# Patient Record
Sex: Male | Born: 1965 | Race: White | Hispanic: No | Marital: Single | Smoking: Never smoker
Health system: Southern US, Community
[De-identification: ages and names within clinical notes are randomized; demographics above are authoritative.]

## PROBLEM LIST (undated history)

## (undated) DIAGNOSIS — E785 Hyperlipidemia, unspecified: Secondary | ICD-10-CM

## (undated) DIAGNOSIS — E7800 Pure hypercholesterolemia, unspecified: Secondary | ICD-10-CM

## (undated) DIAGNOSIS — G8929 Other chronic pain: Secondary | ICD-10-CM

## (undated) DIAGNOSIS — J1282 Pneumonia due to coronavirus disease 2019: Secondary | ICD-10-CM

## (undated) DIAGNOSIS — E119 Type 2 diabetes mellitus without complications: Secondary | ICD-10-CM

## (undated) DIAGNOSIS — U071 COVID-19: Secondary | ICD-10-CM

## (undated) DIAGNOSIS — K5790 Diverticulosis of intestine, part unspecified, without perforation or abscess without bleeding: Secondary | ICD-10-CM

## (undated) DIAGNOSIS — L570 Actinic keratosis: Secondary | ICD-10-CM

## (undated) DIAGNOSIS — M1990 Unspecified osteoarthritis, unspecified site: Secondary | ICD-10-CM

## (undated) DIAGNOSIS — M67911 Unspecified disorder of synovium and tendon, right shoulder: Secondary | ICD-10-CM

## (undated) HISTORY — PX: COLONOSCOPY: SHX174

## (undated) HISTORY — PX: WISDOM TOOTH EXTRACTION: SHX21

## (undated) HISTORY — DX: Actinic keratosis: L57.0

## (undated) HISTORY — PX: TONSILLECTOMY: SUR1361

---

## 2006-11-13 ENCOUNTER — Ambulatory Visit: Payer: Self-pay | Admitting: Gastroenterology

## 2006-12-03 ENCOUNTER — Ambulatory Visit: Payer: Self-pay | Admitting: Gastroenterology

## 2014-07-19 ENCOUNTER — Other Ambulatory Visit: Payer: Self-pay | Admitting: Sports Medicine

## 2014-07-19 DIAGNOSIS — M25512 Pain in left shoulder: Secondary | ICD-10-CM

## 2014-07-29 ENCOUNTER — Other Ambulatory Visit: Payer: Self-pay | Admitting: Sports Medicine

## 2014-07-29 DIAGNOSIS — M25512 Pain in left shoulder: Secondary | ICD-10-CM

## 2014-08-01 ENCOUNTER — Other Ambulatory Visit: Payer: Self-pay

## 2020-06-01 ENCOUNTER — Other Ambulatory Visit: Payer: Self-pay

## 2020-06-01 DIAGNOSIS — Z20822 Contact with and (suspected) exposure to covid-19: Secondary | ICD-10-CM

## 2020-06-03 LAB — SARS-COV-2, NAA 2 DAY TAT

## 2020-06-03 LAB — NOVEL CORONAVIRUS, NAA: SARS-CoV-2, NAA: DETECTED — AB

## 2020-06-12 ENCOUNTER — Emergency Department: Payer: Managed Care, Other (non HMO)

## 2020-06-12 ENCOUNTER — Encounter: Payer: Self-pay | Admitting: Radiology

## 2020-06-12 ENCOUNTER — Other Ambulatory Visit: Payer: Self-pay

## 2020-06-12 ENCOUNTER — Inpatient Hospital Stay
Admission: EM | Admit: 2020-06-12 | Discharge: 2020-06-17 | DRG: 177 | Disposition: A | Discharge status: Home / Self Care | Payer: Managed Care, Other (non HMO) | Attending: Internal Medicine | Admitting: Internal Medicine

## 2020-06-12 DIAGNOSIS — E1165 Type 2 diabetes mellitus with hyperglycemia: Secondary | ICD-10-CM

## 2020-06-12 DIAGNOSIS — R739 Hyperglycemia, unspecified: Secondary | ICD-10-CM | POA: Diagnosis not present

## 2020-06-12 DIAGNOSIS — J9601 Acute respiratory failure with hypoxia: Secondary | ICD-10-CM | POA: Diagnosis present

## 2020-06-12 DIAGNOSIS — U071 COVID-19: Secondary | ICD-10-CM | POA: Diagnosis not present

## 2020-06-12 DIAGNOSIS — E119 Type 2 diabetes mellitus without complications: Secondary | ICD-10-CM

## 2020-06-12 DIAGNOSIS — J1282 Pneumonia due to coronavirus disease 2019: Secondary | ICD-10-CM

## 2020-06-12 HISTORY — DX: Pure hypercholesterolemia, unspecified: E78.00

## 2020-06-12 LAB — CBC
HCT: 40 % (ref 39.0–52.0)
Hemoglobin: 13.4 g/dL (ref 13.0–17.0)
MCH: 29.3 pg (ref 26.0–34.0)
MCHC: 33.5 g/dL (ref 30.0–36.0)
MCV: 87.5 fL (ref 80.0–100.0)
Platelets: 246 10*3/uL (ref 150–400)
RBC: 4.57 MIL/uL (ref 4.22–5.81)
RDW: 11.8 % (ref 11.5–15.5)
WBC: 8.1 10*3/uL (ref 4.0–10.5)
nRBC: 0 % (ref 0.0–0.2)

## 2020-06-12 LAB — BASIC METABOLIC PANEL
Anion gap: 16 — ABNORMAL HIGH (ref 5–15)
BUN: 19 mg/dL (ref 6–20)
CO2: 24 mmol/L (ref 22–32)
Calcium: 9.1 mg/dL (ref 8.9–10.3)
Chloride: 92 mmol/L — ABNORMAL LOW (ref 98–111)
Creatinine, Ser: 0.87 mg/dL (ref 0.61–1.24)
GFR, Estimated: >60 mL/min (ref >60)
Glucose, Bld: 489 mg/dL — ABNORMAL HIGH (ref 70–99)
Potassium: 4.9 mmol/L (ref 3.5–5.1)
Sodium: 132 mmol/L — ABNORMAL LOW (ref 135–145)

## 2020-06-12 MED ORDER — SODIUM CHLORIDE 0.9 % IV BOLUS
500.0000 mL | Freq: Once | INTRAVENOUS | Status: AC
  Administered 2020-06-12: 500 mL via INTRAVENOUS

## 2020-06-12 MED ORDER — SODIUM CHLORIDE 0.9 % IV SOLN
200.0000 mg | Freq: Once | INTRAVENOUS | Status: AC
  Administered 2020-06-13: 200 mg via INTRAVENOUS
  Filled 2020-06-12: qty 200

## 2020-06-12 MED ORDER — METHYLPREDNISOLONE SODIUM SUCC 125 MG IJ SOLR
125.0000 mg | Freq: Once | INTRAMUSCULAR | Status: AC
  Administered 2020-06-12: 125 mg via INTRAVENOUS
  Filled 2020-06-12: qty 2

## 2020-06-12 MED ORDER — INSULIN ASPART 100 UNIT/ML ~~LOC~~ SOLN
5.0000 [IU] | Freq: Once | SUBCUTANEOUS | Status: AC
  Administered 2020-06-12: 5 [IU] via INTRAVENOUS

## 2020-06-12 MED ORDER — INSULIN ASPART 100 UNIT/ML ~~LOC~~ SOLN
SUBCUTANEOUS | Status: AC
  Filled 2020-06-12: qty 1

## 2020-06-12 MED ORDER — SODIUM CHLORIDE 0.9 % IV SOLN
100.0000 mg | Freq: Every day | INTRAVENOUS | Status: AC
  Administered 2020-06-14 – 2020-06-17 (×4): 100 mg via INTRAVENOUS
  Filled 2020-06-12: qty 20
  Filled 2020-06-12: qty 100
  Filled 2020-06-12: qty 20
  Filled 2020-06-12: qty 100

## 2020-06-12 NOTE — Progress Notes (Signed)
Pt placed on HFNC at 8 lpm due to desats. O2 sat currently 93%

## 2020-06-12 NOTE — ED Provider Notes (Signed)
[facility] Emergency Department Provider Note  ____________________________________________   Event Date/Time   First MD Initiated Contact with Patient 06/12/20 2310     (approximate)  I have reviewed the triage vital signs and the nursing notes.   HISTORY  Chief Complaint Shortness of Breath    HPI Christian Vazquez is a 55 y.o. male with no significant chronic medical issues who is generally quite healthy.  He is unvaccinated for COVID-19.  He presents for worsening shortness of breath over the last 3 days in the setting of a positive COVID-19 diagnosis about 2 weeks ago January 12).  He said that he felt better initially with fever, loss of smell and taste, sore throat, and shortness of breath, but that he started to feel better.  However he started feel worse again over the last 3 days and has become severe as of tonight.  Exertion makes his symptoms much worse and rest makes them a little bit better.  He still has changes in taste but the primary issue is the acute dyspnea.  He has no history of blood clots.  He has no history of diabetes.  He is not having chest pain.           Past Medical History:  Diagnosis Date  . Hypercholesteremia     Patient Active Problem List   Diagnosis Date Noted  . Pneumonia due to COVID-19 virus 06/13/2020  . Acute respiratory failure due to COVID-19 ([place]) 06/13/2020  . Hyperglycemia due to new onset type 2 diabetes mellitus ([place]) 06/13/2020    History reviewed. No pertinent surgical history.  Prior to Admission medications   Not on File    Allergies Patient has no known allergies.  History reviewed. No pertinent family history.  Social History Social History   Tobacco Use  . Smoking status: Never Smoker  . Smokeless tobacco: Never Used    Review of Systems Constitutional: No fever/chills Eyes: No visual changes. ENT: Changes to smell and taste.  No sore throat. Cardiovascular: Denies chest  pain. Respiratory: Severe shortness of breath particular with exertion. Gastrointestinal: No abdominal pain.  No nausea, no vomiting.  No diarrhea.  No constipation. Genitourinary: Negative for dysuria. Musculoskeletal: Negative for neck pain.  Negative for back pain. Integumentary: Negative for rash. Neurological: Negative for headaches, focal weakness or numbness.   ____________________________________________   PHYSICAL EXAM:  VITAL SIGNS: ED Triage Vitals [06/12/20 2003]  Enc Vitals Group     BP 127/86     Pulse Rate (!) 112     Resp 20     Temp 97.7 F (36.5 C)     Temp Source Oral     SpO2 (!) 77 %     Weight 83.5 kg (184 lb)     Height 1.753 m (5\' 9" )     Head Circumference      Peak Flow      Pain Score 0     Pain Loc      Pain Edu?      Excl. in [place]?     Constitutional: Alert and oriented.  Eyes: Conjunctivae are normal.  Head: Atraumatic. Nose: No congestion/rhinnorhea. Mouth/Throat: Patient is wearing a mask. Neck: No stridor.  No meningeal signs.   Cardiovascular: Tachycardia, regular rhythm. Good peripheral circulation. Respiratory: Mild tachypnea, good air movement throughout with no significant course of breath sounds.  Occasional cough when taking deep breaths.  Patient is speaking full sentences but is currently on substantial oxygen support  of 15 L/min of high flow nasal cannula which is maintaining oxygenation in the upper 80s to lower 90s. Gastrointestinal: Soft and nontender. No distention.  Musculoskeletal: No lower extremity tenderness nor edema. No gross deformities of extremities. Neurologic:  Normal speech and language. No gross focal neurologic deficits are appreciated.  Skin:  Skin is warm, dry and intact. Psychiatric: Mood and affect are normal. Speech and behavior are normal.  ____________________________________________   LABS (all labs ordered are listed, but only abnormal results are displayed)  Labs Reviewed  BASIC METABOLIC PANEL  - Abnormal; Notable for the following components:      Result Value   Sodium 132 (*)    Chloride 92 (*)    Glucose, Bld 489 (*)    Anion gap 16 (*)    All other components within normal limits  CBG MONITORING, ED - Abnormal; Notable for the following components:   Glucose-Capillary 295 (*)    All other components within normal limits  CBC  HEMOGLOBIN A1C   ____________________________________________  EKG  ED ECG REPORT I, Hinda Kehr, the attending physician, personally viewed and interpreted this ECG.  Date: 06/11/2020 EKG Time: 20: 17 Rate: 104 Rhythm: Sinus tachycardia QRS Axis: normal Intervals: normal ST/T Wave abnormalities: normal Narrative Interpretation: no evidence of acute ischemia ____________________________________________  RADIOLOGY I, Hinda Kehr, personally viewed and evaluated these images (plain radiographs) as part of my medical decision making, as well as reviewing the written report by the radiologist.  ED MD interpretation: Extensive multifocal bilateral pneumonia compatible with COVID-19.  CTA chest demonstrates no evidence of PE but confirms multifocal pneumonia.  Official radiology report(s): DG Chest 2 View  Result Date: 06/12/2020 CLINICAL DATA:  COVID-19 diagnosed 06/01/2020, increasing shortness of breath, hypoxia EXAM: CHEST - 2 VIEW COMPARISON:  None. FINDINGS: Frontal and lateral views of the chest demonstrate an unremarkable cardiac silhouette. Multifocal bilateral airspace disease greatest in the right upper and left lower lobes. No effusion or pneumothorax. No acute bony abnormalities. IMPRESSION: 1. Multifocal bilateral pneumonia compatible with COVID 19. Electronically Signed   By: Randa Ngo M.D.   On: 06/12/2020 20:45   CT Angio Chest PE W/Cm &/Or Wo Cm  Result Date: 06/13/2020 CLINICAL DATA:  Pulmonary embolus suspected with high probability. COVID diagnosis on January 12. Increasing shortness of breath with decreased oxygen  saturations. EXAM: CT ANGIOGRAPHY CHEST WITH CONTRAST TECHNIQUE: Multidetector CT imaging of the chest was performed using the standard protocol during bolus administration of intravenous contrast. Multiplanar CT image reconstructions and MIPs were obtained to evaluate the vascular anatomy. CONTRAST:  140mL OMNIPAQUE IOHEXOL 350 MG/ML SOLN COMPARISON:  Chest radiograph 06/12/2020 FINDINGS: Cardiovascular: Good opacification of the central and segmental pulmonary arteries. No focal filling defects. No evidence of significant pulmonary embolus. Cardiac enlargement. No pericardial effusions. Normal caliber thoracic aorta. No dissection. Great vessel origins are patent. Mediastinum/Nodes: Esophagus is decompressed. Scattered lymph nodes are not pathologically enlarged. Lungs/Pleura: Patchy and confluent alveolar infiltrates in the lungs, most prominent in the periphery. Changes are consistent with COVID pneumonia. No pleural effusions. No pneumothorax. Airways are patent. Upper Abdomen: Diffuse fatty infiltration of the liver. No acute abnormalities demonstrated in the visualized upper abdomen. Musculoskeletal: No chest wall abnormality. No acute or significant osseous findings. Review of the MIP images confirms the above findings. IMPRESSION: 1. No evidence of significant pulmonary embolus. 2. Patchy and confluent alveolar infiltrates in the lungs, most prominent in the periphery. Changes are consistent with COVID pneumonia. 3. Diffuse fatty infiltration of the liver.  Electronically Signed   By: Lucienne Capers M.D.   On: 06/13/2020 00:33    ____________________________________________   PROCEDURES   Procedure(s) performed (including Critical Care):  .Critical Care Performed by: Hinda Kehr, MD Authorized by: Hinda Kehr, MD   Critical care provider statement:    Critical care time (minutes):  45   Critical care time was exclusive of:  Separately billable procedures and treating other patients    Critical care was necessary to treat or prevent imminent or life-threatening deterioration of the following conditions:  Respiratory failure   Critical care was time spent personally by me on the following activities:  Development of treatment plan with patient or surrogate, discussions with consultants, evaluation of patient's response to treatment, examination of patient, obtaining history from patient or surrogate, ordering and performing treatments and interventions, ordering and review of laboratory studies, ordering and review of radiographic studies, pulse oximetry, re-evaluation of patient's condition and review of old charts .1-3 Lead EKG Interpretation Performed by: Hinda Kehr, MD Authorized by: Hinda Kehr, MD     Interpretation: abnormal     ECG rate:  106   ECG rate assessment: tachycardic     Rhythm: sinus tachycardia     Ectopy: none     Conduction: normal       ____________________________________________   INITIAL IMPRESSION / MDM / ASSESSMENT AND PLAN / ED COURSE  As part of my medical decision making, I reviewed the following data within the [place] notes reviewed and incorporated, Labs reviewed , EKG interpreted , Old chart reviewed, Radiograph reviewed , Discussed with admitting physician  and Notes from prior ED visits   Differential diagnosis includes, but is not limited to, COVID-19 pneumonia, pulmonary embolism, new onset diabetes, bacterial pneumonia, heart failure.  The patient is on the cardiac monitor to evaluate for evidence of arrhythmia and/or significant heart rate changes.  I verified in the computer that the patient had a positive COVID-19 diagnosis on 06/01/2020.  Given his profound hypoxemia with generally normal breath sounds and his tachycardia I am concerned about the possibility of pulmonary embolism.  I personally reviewed the patient's imaging and agree with the radiologist's interpretation that he has extensive  multifocal pneumonia and it may simply be the extent of the COVID-19 infection, but I will obtain CTA chest to rule out PE.  Given his positive COVID-19 diagnosis there is no benefit to waiting on a D-dimer.  Lab work is notable for a normal CBC and a normal BMP except for a glucose of 489 in the setting of no prior diagnosis of diabetes.  His anion gap is 16 but I do not feel that this represents DKA, rather is reflective of his volume status, respiratory failure, etc.  I am giving him 500 mL normal saline as well as regular insulin 5 units IV and will recheck a CBG in about an hour.  I ordered Solu-Medrol 125 mg IV and remdesivir given the respiratory failure due to COVID-19.  Anticipate hospitalist admission.     Clinical Course as of 06/13/20 0127  Nancy Fetter Jun 12, 2020  2346 I also ordered a hemoglobin A1c so that it could hopefully start being processed as it will be needed for his inpatient management. [CF]  Mon Jun 13, 2020  0056 CT Angio Chest PE W/Cm &/Or Wo Cm Generally reassuring CTA chest with no evidence of PE.  Infiltrates consistent with multifocal COVID-19 pneumonia.  Patient stable on supplemental oxygen as described above.  Consulting  hospitalist service for admission. [CF]  0127 Discussed case by phone with Dr. Damita Dunnings with hospitalist service who will admit. [CF]    Clinical Course User Index [CF] Hinda Kehr, MD     ____________________________________________  FINAL CLINICAL IMPRESSION(S) / ED DIAGNOSES  Final diagnoses:  Acute hypoxemic respiratory failure due to COVID-19 [facility])  Hyperglycemia     MEDICATIONS GIVEN DURING THIS VISIT:  Medications  remdesivir 200 mg in sodium chloride 0.9% 250 mL IVPB (200 mg Intravenous New Bag/Given 06/13/20 0122)    Followed by  remdesivir 100 mg in sodium chloride 0.9 % 100 mL IVPB (has no administration in time range)  methylPREDNISolone sodium succinate (SOLU-MEDROL) 125 mg/2 mL injection 125 mg (125 mg Intravenous Given  06/12/20 2331)  insulin aspart (novoLOG) injection 5 Units (5 Units Intravenous Given 06/12/20 2339)  sodium chloride 0.9 % bolus 500 mL (0 mLs Intravenous Stopped 06/13/20 0056)  iohexol (OMNIPAQUE) 350 MG/ML injection 100 mL (100 mLs Intravenous Contrast Given 06/13/20 0025)     ED Discharge Orders    None      *Please note:  Christian Vazquez was evaluated in Emergency Department on 06/13/2020 for the symptoms described in the history of present illness. He was evaluated in the context of the global COVID-19 pandemic, which necessitated consideration that the patient might be at risk for infection with the SARS-CoV-2 virus that causes COVID-19. Institutional protocols and algorithms that pertain to the evaluation of patients at risk for COVID-19 are in a state of rapid change based on information released by regulatory bodies including the CDC and federal and state organizations. These policies and algorithms were followed during the patient's care in the ED.  Some ED evaluations and interventions may be delayed as a result of limited staffing during and after the pandemic.*  Note:  This document was prepared using Dragon voice recognition software and may include unintentional dictation errors.   Hinda Kehr, MD 06/13/20 [phone]

## 2020-06-12 NOTE — ED Notes (Signed)
Verbal order from Dr. Cherylann Banas to call respiratory to place pt no HFNC. This RN called.

## 2020-06-12 NOTE — ED Notes (Signed)
Oxygen tank switched out.  ?

## 2020-06-12 NOTE — Progress Notes (Signed)
Remdesivir - Pharmacy Brief Note   A/P:  Remdesivir 200 mg IVPB once followed by 100 mg IVPB daily x 4 days.   Naman Spychalski, PharmD Clinical Pharmacist  

## 2020-06-12 NOTE — ED Triage Notes (Signed)
Pt to ED reporting a COVID dx on Jan 12th that he has not been able to recover form., increasing SOB with oxygen saturations in the 70s at home on RA. Pt was 77% on RA upon arrival. Immediately placed on 2L of oxygen that only increased pt to 86%. Pts oxygen then increased to 3L via [place]. Pt reporting dizziness and generalized fatigue. Afebrile.

## 2020-06-13 DIAGNOSIS — E119 Type 2 diabetes mellitus without complications: Secondary | ICD-10-CM | POA: Diagnosis not present

## 2020-06-13 DIAGNOSIS — J9601 Acute respiratory failure with hypoxia: Secondary | ICD-10-CM

## 2020-06-13 DIAGNOSIS — U071 COVID-19: Principal | ICD-10-CM

## 2020-06-13 DIAGNOSIS — J1282 Pneumonia due to coronavirus disease 2019: Secondary | ICD-10-CM

## 2020-06-13 DIAGNOSIS — E1165 Type 2 diabetes mellitus with hyperglycemia: Secondary | ICD-10-CM

## 2020-06-13 DIAGNOSIS — R739 Hyperglycemia, unspecified: Secondary | ICD-10-CM | POA: Diagnosis present

## 2020-06-13 LAB — HEMOGLOBIN A1C
Hgb A1c MFr Bld: 11 % — ABNORMAL HIGH (ref 4.8–5.6)
Mean Plasma Glucose: 269 mg/dL

## 2020-06-13 LAB — CBG MONITORING, ED
Glucose-Capillary: 250 mg/dL — ABNORMAL HIGH (ref 70–99)
Glucose-Capillary: 295 mg/dL — ABNORMAL HIGH (ref 70–99)
Glucose-Capillary: 307 mg/dL — ABNORMAL HIGH (ref 70–99)
Glucose-Capillary: 312 mg/dL — ABNORMAL HIGH (ref 70–99)
Glucose-Capillary: 324 mg/dL — ABNORMAL HIGH (ref 70–99)

## 2020-06-13 MED ORDER — ONDANSETRON HCL 4 MG/2ML IJ SOLN: 4.0000 mg | Freq: Four times a day (QID) | INTRAMUSCULAR | Status: DC | PRN

## 2020-06-13 MED ORDER — PREDNISONE 50 MG PO TABS
50.0000 mg | ORAL_TABLET | Freq: Every day | ORAL | Status: DC
  Administered 2020-06-16 – 2020-06-17 (×2): 50 mg via ORAL
  Filled 2020-06-13 (×2): qty 1

## 2020-06-13 MED ORDER — METHYLPREDNISOLONE SODIUM SUCC 125 MG IJ SOLR
0.5000 mg/kg | Freq: Two times a day (BID) | INTRAMUSCULAR | Status: AC
  Administered 2020-06-13 – 2020-06-15 (×6): 41.875 mg via INTRAVENOUS
  Filled 2020-06-13 (×6): qty 2

## 2020-06-13 MED ORDER — GUAIFENESIN-DM 100-10 MG/5ML PO SYRP: 10.0000 mL | ORAL_SOLUTION | ORAL | Status: DC | PRN

## 2020-06-13 MED ORDER — ENOXAPARIN SODIUM 40 MG/0.4ML ~~LOC~~ SOLN
40.0000 mg | SUBCUTANEOUS | Status: DC
  Administered 2020-06-13 – 2020-06-17 (×5): 40 mg via SUBCUTANEOUS
  Filled 2020-06-13 (×5): qty 0.4

## 2020-06-13 MED ORDER — ALBUTEROL SULFATE HFA 108 (90 BASE) MCG/ACT IN AERS
2.0000 | INHALATION_SPRAY | Freq: Four times a day (QID) | RESPIRATORY_TRACT | Status: DC
  Administered 2020-06-13 – 2020-06-17 (×18): 2 via RESPIRATORY_TRACT
  Filled 2020-06-13: qty 6.7

## 2020-06-13 MED ORDER — HYDROCOD POLST-CPM POLST ER 10-8 MG/5ML PO SUER: 5.0000 mL | Freq: Two times a day (BID) | ORAL | Status: DC | PRN

## 2020-06-13 MED ORDER — LINAGLIPTIN 5 MG PO TABS
5.0000 mg | ORAL_TABLET | Freq: Every day | ORAL | Status: DC
  Administered 2020-06-14 – 2020-06-17 (×4): 5 mg via ORAL
  Filled 2020-06-13 (×7): qty 1

## 2020-06-13 MED ORDER — IOHEXOL 350 MG/ML SOLN
100.0000 mL | Freq: Once | INTRAVENOUS | Status: AC | PRN
  Administered 2020-06-13: 100 mL via INTRAVENOUS

## 2020-06-13 MED ORDER — ONDANSETRON HCL 4 MG PO TABS: 4.0000 mg | ORAL_TABLET | Freq: Four times a day (QID) | ORAL | Status: DC | PRN

## 2020-06-13 MED ORDER — INSULIN ASPART 100 UNIT/ML ~~LOC~~ SOLN
4.0000 [IU] | Freq: Three times a day (TID) | SUBCUTANEOUS | Status: DC
  Administered 2020-06-13 – 2020-06-14 (×5): 4 [IU] via SUBCUTANEOUS
  Filled 2020-06-13 (×3): qty 1

## 2020-06-13 MED ORDER — BARICITINIB 2 MG PO TABS
2.0000 mg | ORAL_TABLET | Freq: Every day | ORAL | Status: DC
  Administered 2020-06-13: 2 mg via ORAL
  Filled 2020-06-13: qty 1

## 2020-06-13 MED ORDER — LIVING WELL WITH DIABETES BOOK
Freq: Once | Status: AC
  Filled 2020-06-13: qty 1

## 2020-06-13 MED ORDER — INSULIN ASPART 100 UNIT/ML ~~LOC~~ SOLN
0.0000 [IU] | Freq: Every day | SUBCUTANEOUS | Status: DC
  Administered 2020-06-13: 4 [IU] via SUBCUTANEOUS
  Administered 2020-06-14: 2 [IU] via SUBCUTANEOUS
  Administered 2020-06-15: 4 [IU] via SUBCUTANEOUS
  Filled 2020-06-13 (×3): qty 1

## 2020-06-13 MED ORDER — INSULIN DETEMIR 100 UNIT/ML ~~LOC~~ SOLN
0.1000 [IU]/kg | Freq: Two times a day (BID) | SUBCUTANEOUS | Status: DC
  Administered 2020-06-13 (×2): 8 [IU] via SUBCUTANEOUS
  Filled 2020-06-13 (×3): qty 0.08

## 2020-06-13 MED ORDER — INSULIN ASPART 100 UNIT/ML ~~LOC~~ SOLN
0.0000 [IU] | Freq: Three times a day (TID) | SUBCUTANEOUS | Status: DC
  Administered 2020-06-13: 11 [IU] via SUBCUTANEOUS
  Administered 2020-06-13: 5 [IU] via SUBCUTANEOUS
  Administered 2020-06-13 – 2020-06-14 (×2): 11 [IU] via SUBCUTANEOUS
  Administered 2020-06-14: 15 [IU] via SUBCUTANEOUS
  Administered 2020-06-14: 11 [IU] via SUBCUTANEOUS
  Administered 2020-06-15: 8 [IU] via SUBCUTANEOUS
  Administered 2020-06-15: 11 [IU] via SUBCUTANEOUS
  Administered 2020-06-15: 3 [IU] via SUBCUTANEOUS
  Administered 2020-06-16: 5 [IU] via SUBCUTANEOUS
  Administered 2020-06-16: 3 [IU] via SUBCUTANEOUS
  Administered 2020-06-17: 8 [IU] via SUBCUTANEOUS
  Filled 2020-06-13 (×11): qty 1

## 2020-06-13 MED ORDER — ASCORBIC ACID 500 MG PO TABS
500.0000 mg | ORAL_TABLET | Freq: Every day | ORAL | Status: DC
  Administered 2020-06-13 – 2020-06-17 (×5): 500 mg via ORAL
  Filled 2020-06-13 (×5): qty 1

## 2020-06-13 MED ORDER — ZINC SULFATE 220 (50 ZN) MG PO CAPS
220.0000 mg | ORAL_CAPSULE | Freq: Every day | ORAL | Status: DC
  Administered 2020-06-13 – 2020-06-17 (×5): 220 mg via ORAL
  Filled 2020-06-13 (×5): qty 1

## 2020-06-13 MED ORDER — ACETAMINOPHEN 325 MG PO TABS: 650.0000 mg | ORAL_TABLET | Freq: Four times a day (QID) | ORAL | Status: DC | PRN

## 2020-06-13 MED ORDER — INSULIN STARTER KIT- PEN NEEDLES (ENGLISH)
1.0000 | Freq: Once | Status: AC
  Administered 2020-06-13: 1
  Filled 2020-06-13: qty 1

## 2020-06-13 MED ORDER — BARICITINIB 1 MG PO TABS
4.0000 mg | ORAL_TABLET | Freq: Every day | ORAL | Status: DC
  Administered 2020-06-14 – 2020-06-17 (×4): 4 mg via ORAL
  Filled 2020-06-13 (×3): qty 4
  Filled 2020-06-13: qty 2

## 2020-06-13 NOTE — ED Notes (Signed)
Patient denies pain and is resting comfortably.  

## 2020-06-13 NOTE — ED Notes (Signed)
PT PLACED ON NONREBREATHER AT 15LPM OVER HFNC AT 15LPM FOR POX OF 84%.

## 2020-06-13 NOTE — ED Notes (Signed)
Waiting on remdisivir

## 2020-06-13 NOTE — ED Notes (Signed)
Pharmacy to send up medications 

## 2020-06-13 NOTE — ED Notes (Signed)
Instructed patient on how to lie prone. Advised patient if he is able to tolerate, then to do so.

## 2020-06-13 NOTE — ED Notes (Signed)
2 PUFFS OF VENTOLIN ADMINISTERED. COMPUTER WILL NOT ALLOW RN TO COMPLETE DOCUMENTATION OF ADMINISTRATION.

## 2020-06-13 NOTE — ED Notes (Signed)
REPORT TO Aldona Bar, RN.

## 2020-06-13 NOTE — ED Notes (Signed)
Patient transported to CT 

## 2020-06-13 NOTE — Progress Notes (Addendum)
Inpatient Diabetes Program Recommendations  AACE/ADA: New Consensus Statement on Inpatient Glycemic Control   Target Ranges:  Prepandial:   less than 140 mg/dL      Peak postprandial:   less than 180 mg/dL (1-2 hours)      Critically ill patients:  140 - 180 mg/dL  Results for Christian Vazquez, Christian Vazquez (MRN [id number]) as of 06/13/2020 08:41  Ref. Range 06/13/2020 00:56 06/13/2020 08:37  Glucose-Capillary Latest Ref Range: 70 - 99 mg/dL 295 (H) 324 (H)   Results for Christian Vazquez, Christian Vazquez (MRN [id number]) as of 06/13/2020 08:41  Ref. Range 06/12/2020 20:29  Glucose Latest Ref Range: 70 - 99 mg/dL 489 (H)  Hemoglobin A1C Latest Ref Range: 4.8 - 5.6 % 11.0 (H)   Review of Glycemic Control  Diabetes history: No Outpatient Diabetes medications: NA Current orders for Inpatient glycemic control: Levemir 8 units BID, Novolog 0-15 units TID with meals, Novolog 0-5 units QHS, Novolog 4 units TID with meals, Tradjenta 5 mg daily; Solumedrol 41.875 mg Q12H  Inpatient Diabetes Program Recommendations:    HbgA1C: A1C 11% on 06/12/20 indicating an average glucose of 269 mg/dl over the past 2-3 months.  NOTE: Per chart review, patient has no prior DM hx. Initial glucose 489 mg/dl and A1C 11% on 06/12/20. Ordered Living Well with DM book, insulin starter kit, RD consult for diet education, and patient education by bedside nursing. Will plan to speak with patient when appropriate about new DM dx.  Addendum 06/13/20_0 :45-Spoke with patient over phone regarding glucose and new DM dx. Patient states that he has a PCP ([facility], [place]) and that he last seen PCP about 1 year ago. Patient states that he had lab work done with last visit and as far as he knows his glucose was normal at that time. Patient states that he has been sick with COVID and that 1 week ago he used a teledoc and was prescribed Prednisone (for 5 days) which he finished taking the Prednisone pills as prescribed. Discussed initial glucose  of 489 mg/dl and A1C of 11% on 06/12/20. Explained how steroids impact glycemic control and informed patient that recent steroids likely contributed to initial glucose and A1C results on 06/12/20. Patient reports that he has not noticed any symptoms of hyperglycemia over the past week but also notes that since he has been sick with COVID he has not had very much to eat or drink and he feels he was dehydrated over the past week. Discussed impact of nutrition, exercise, stress, sickness, and medications on diabetes control. Informed patient that a Living Well with diabetes booklet was ordered and patient states that he has the book. Encouraged patient to read through entire book to learn more about DM. Informed patient that diabetes coordinator will call him back tomorrow to discuss DM further and encouraged him to write down any questions he has as he reads through the Living Well with DM book. Patient verbalized understanding of information discussed and he states that he has no further questions at this time related to diabetes.   RNs to provide ongoing basic DM education at bedside with this patient and engage patient to actively check blood glucose and administer insulin injections.   Thanks, Barnie Alderman, RN, MSN, CDE Diabetes Coordinator Inpatient Diabetes Program [phone] (Team Pager from 8am to 5pm)

## 2020-06-13 NOTE — Progress Notes (Signed)
Patient ID: Christian Vazquez, male   DOB: 09/14/1965, 55 y.o.   MRN: [id number] [facility] PROGRESS NOTE  PARKS CZAJKOWSKI VOZ:[id number] DOB: 08-02-1965 DOA: 06/12/2020 PCP: System, Provider Not In  HPI/Subjective: Patient coming in feeling very weak, short of breath.  Patient with COVID-19 pneumonia and acute hypoxic respiratory failure requiring 100% nonrebreather at 15 L high flow nasal cannula.  Objective: Vitals:   06/13/20 0915 06/13/20 1030  BP:  (!) 122/98  Pulse: 95 (!) 106  Resp: (!) 25 13  Temp:    SpO2: 92% 95%    Intake/Output Summary (Last 24 hours) at 06/13/2020 1300 Last data filed at 06/13/2020 0839 Gross per 24 hour  Intake 790 ml  Output 1900 ml  Net -1110 ml   Filed Weights   06/12/20 2003 06/13/20 0710  Weight: 83.5 kg 83.5 kg    ROS: Review of Systems  Respiratory: Positive for shortness of breath.   Cardiovascular: Negative for chest pain.  Gastrointestinal: Negative for abdominal pain, nausea and vomiting.   Exam: Physical Exam HENT:     Head: Normocephalic.     Mouth/Throat:     Pharynx: No oropharyngeal exudate.  Eyes:     General: Lids are normal.     Conjunctiva/sclera: Conjunctivae normal.  Cardiovascular:     Rate and Rhythm: Normal rate and regular rhythm.     Heart sounds: Normal heart sounds, S1 normal and S2 normal.  Pulmonary:     Breath sounds: Examination of the right-lower field reveals decreased breath sounds and rhonchi. Examination of the left-lower field reveals decreased breath sounds and rhonchi. Decreased breath sounds and rhonchi present. No wheezing or rales.  Abdominal:     Palpations: Abdomen is soft.     Tenderness: There is no abdominal tenderness.  Musculoskeletal:     Right lower leg: No swelling.     Left lower leg: No swelling.  Skin:    General: Skin is warm.     Findings: No rash.  Neurological:     Mental Status: He is alert and oriented to person, place, and time.       Data Reviewed: Basic  Metabolic Panel: Recent Labs  Lab 06/12/20 2029  NA 132*  K 4.9  CL 92*  CO2 24  GLUCOSE 489*  BUN 19  CREATININE 0.87  CALCIUM 9.1   CBC: Recent Labs  Lab 06/12/20 2029  WBC 8.1  HGB 13.4  HCT 40.0  MCV 87.5  PLT 246    CBG: Recent Labs  Lab 06/13/20 0056 06/13/20 0837 06/13/20 1151  GLUCAP 295* 324* 307*     Studies: DG Chest 2 View  Result Date: 06/12/2020 CLINICAL DATA:  COVID-19 diagnosed 06/01/2020, increasing shortness of breath, hypoxia EXAM: CHEST - 2 VIEW COMPARISON:  None. FINDINGS: Frontal and lateral views of the chest demonstrate an unremarkable cardiac silhouette. Multifocal bilateral airspace disease greatest in the right upper and left lower lobes. No effusion or pneumothorax. No acute bony abnormalities. IMPRESSION: 1. Multifocal bilateral pneumonia compatible with COVID 19. Electronically Signed   By: Randa Ngo M.D.   On: 06/12/2020 20:45   CT Angio Chest PE W/Cm &/Or Wo Cm  Result Date: 06/13/2020 CLINICAL DATA:  Pulmonary embolus suspected with high probability. COVID diagnosis on January 12. Increasing shortness of breath with decreased oxygen saturations. EXAM: CT ANGIOGRAPHY CHEST WITH CONTRAST TECHNIQUE: Multidetector CT imaging of the chest was performed using the standard protocol during bolus administration of intravenous contrast. Multiplanar CT image reconstructions and  MIPs were obtained to evaluate the vascular anatomy. CONTRAST:  191mL OMNIPAQUE IOHEXOL 350 MG/ML SOLN COMPARISON:  Chest radiograph 06/12/2020 FINDINGS: Cardiovascular: Good opacification of the central and segmental pulmonary arteries. No focal filling defects. No evidence of significant pulmonary embolus. Cardiac enlargement. No pericardial effusions. Normal caliber thoracic aorta. No dissection. Great vessel origins are patent. Mediastinum/Nodes: Esophagus is decompressed. Scattered lymph nodes are not pathologically enlarged. Lungs/Pleura: Patchy and confluent alveolar  infiltrates in the lungs, most prominent in the periphery. Changes are consistent with COVID pneumonia. No pleural effusions. No pneumothorax. Airways are patent. Upper Abdomen: Diffuse fatty infiltration of the liver. No acute abnormalities demonstrated in the visualized upper abdomen. Musculoskeletal: No chest wall abnormality. No acute or significant osseous findings. Review of the MIP images confirms the above findings. IMPRESSION: 1. No evidence of significant pulmonary embolus. 2. Patchy and confluent alveolar infiltrates in the lungs, most prominent in the periphery. Changes are consistent with COVID pneumonia. 3. Diffuse fatty infiltration of the liver. Electronically Signed   By: Lucienne Capers M.D.   On: 06/13/2020 00:33    Scheduled Meds: . albuterol  2 puff Inhalation Q6H  . vitamin C  500 mg Oral Daily  . [START ON 06/14/2020] baricitinib  4 mg Oral Daily  . enoxaparin (LOVENOX) injection  40 mg Subcutaneous Q24H  . insulin aspart  0-15 Units Subcutaneous TID WC  . insulin aspart  0-5 Units Subcutaneous QHS  . insulin aspart  4 Units Subcutaneous TID WC  . insulin detemir  0.1 Units/kg Subcutaneous BID  . linagliptin  5 mg Oral Daily  . methylPREDNISolone (SOLU-MEDROL) injection  0.5 mg/kg Intravenous Q12H   Followed by  . [START ON 06/16/2020] predniSONE  50 mg Oral Daily  . zinc sulfate  220 mg Oral Daily   Continuous Infusions: . [START ON 06/14/2020] remdesivir 100 mg in NS 100 mL      Assessment/Plan:  1. Acute hypoxic respiratory failure with COVID-19 pneumonia.  This morning patient on 100% nonrebreather +15 L high flow nasal cannula.  High risk for requiring heated high flow nasal cannula.  Need to watch pulse ox closely.  Continue steroids and baricitinib.  Since symptoms going on for over 2 weeks no need to give remdesivir at this point 2. Type 2 diabetes mellitus with elevated hemoglobin A1c and hyperglycemia.  Sugars will be high with steroids.  On Levemir twice a day  and short acting insulin.  Dietitian consultation.  The patient had no idea that he had elevated sugars.    Code Status:     Code Status Orders  (From admission, onward)         Start     Ordered   06/13/20 0130  Full code  Continuous        06/13/20 0132        Code Status History    This patient has a current code status but no historical code status.   Advance Care Planning Activity     Family Communication: Spoke with Estill Bamberg on the phone Disposition Plan: Status is: Inpatient  Dispo: The patient is from: Home              Anticipated d/c is to: Home              Anticipated d/c date is: likely will need a week here              Patient currently not stable to go home, on HFNC and 100% NRBR  Difficult to place patient: No  Time spent: 32 minutes  [place]

## 2020-06-13 NOTE — ED Notes (Signed)
Patient Lying prone. Tolerating well. Advised that he will remain prone as long as he can tolerate.

## 2020-06-13 NOTE — ED Notes (Signed)
Pt given breakfast tray

## 2020-06-13 NOTE — H&P (Signed)
History and Physical    ZAEL SHUMAN PPJ:[id number] DOB: 1966-01-29 DOA: 06/12/2020  PCP: System, Provider Not In   Patient coming from: Home  I have personally briefly reviewed patient's old medical records in [place]  Chief Complaint: Shortness of breath, Covid positive since 06/01/2020  HPI: DODGER SINNING is a 55 y.o. male with no significant past medical history, unvaccinated against Covid, diagnosed with Covid on 1/12 after having symptoms of fever, sore throat loss of smell and taste and shortness of breath and was initially starting to feel better who states over the past 3 days started feeling worse with acute worsening of shortness of breath on the night of arrival.  His shortness of breath worsens with minimal exertion.  He denies fever, chest pain, lower extremity pain or swelling  ED Course: On arrival, afebrile, BP 127/86, pulse 112 with respirations of 20 and O2 sats 77% on room air requiring 15 L to maintain sats in the low 90s.  CBC within normal limits.  BMP significant for blood glucose of 489 with no prior history of diabetes. EKG as reviewed by me : Sinus tachycardia at 104 Imaging: CTA chest negative for PE but shows patchy alveolar infiltrates in the lungs consistent with Covid pneumonia  Hospitalist consulted for admission  Review of Systems: As per HPI otherwise all other systems on review of systems negative.    Past Medical History:  Diagnosis Date  . Hypercholesteremia     History reviewed. No pertinent surgical history.   reports that he has never smoked. He has never used smokeless tobacco. No history on file for alcohol use and drug use.  No Known Allergies  History reviewed. No pertinent family history.    Prior to Admission medications   Not on File    Physical Exam: Vitals:   06/12/20 2250 06/12/20 2330 06/13/20 0045 06/13/20 0100  BP:  110/88    Pulse:  (!) 106 (!) 104 90  Resp:  (!) 23 (!) 22 19  Temp:      TempSrc:       SpO2: 93% (!) 87% 95% 94%  Weight:      Height:         Vitals:   06/12/20 2250 06/12/20 2330 06/13/20 0045 06/13/20 0100  BP:  110/88    Pulse:  (!) 106 (!) 104 90  Resp:  (!) 23 (!) 22 19  Temp:      TempSrc:      SpO2: 93% (!) 87% 95% 94%  Weight:      Height:          Constitutional: Alert and oriented x 3 .  Conversational dyspnea and speaking in short sentences  HEENT:      Head: Normocephalic and atraumatic.         Eyes: PERLA, EOMI, Conjunctivae are normal. Sclera is non-icteric.       Mouth/Throat: Mucous membranes are moist.       Neck: Supple with no signs of meningismus. Cardiovascular: Regular rate and rhythm. No murmurs, gallops, or rubs. 2+ symmetrical distal pulses are present . No JVD. No LE edema Respiratory: Respiratory effort increased with coarse breath sounds bilaterally Gastrointestinal: Soft, non tender, and non distended with positive bowel sounds.  Genitourinary: No CVA tenderness. Musculoskeletal: Nontender with normal range of motion in all extremities. No cyanosis, or erythema of extremities. Neurologic:  Face is symmetric. Moving all extremities. No gross focal neurologic deficits . Skin: Skin is warm, dry.  No rash or ulcers Psychiatric: Mood and affect are normal    Labs on Admission: I have personally reviewed following labs and imaging studies  CBC: Recent Labs  Lab 06/12/20 2029  WBC 8.1  HGB 13.4  HCT 40.0  MCV 87.5  PLT 246   Basic Metabolic Panel: Recent Labs  Lab 06/12/20 2029  NA 132*  K 4.9  CL 92*  CO2 24  GLUCOSE 489*  BUN 19  CREATININE 0.87  CALCIUM 9.1   GFR: Estimated Creatinine Clearance: 94.8 mL/min (by C-G formula based on SCr of 0.87 mg/dL). Liver Function Tests: No results for input(s): AST, ALT, ALKPHOS, BILITOT, PROT, ALBUMIN in the last 168 hours. No results for input(s): LIPASE, AMYLASE in the last 168 hours. No results for input(s): AMMONIA in the last 168 hours. Coagulation Profile: No  results for input(s): INR, PROTIME in the last 168 hours. Cardiac Enzymes: No results for input(s): CKTOTAL, CKMB, CKMBINDEX, TROPONINI in the last 168 hours. BNP (last 3 results) No results for input(s): PROBNP in the last 8760 hours. HbA1C: No results for input(s): HGBA1C in the last 72 hours. CBG: Recent Labs  Lab 06/13/20 0056  GLUCAP 295*   Lipid Profile: No results for input(s): CHOL, HDL, LDLCALC, TRIG, CHOLHDL, LDLDIRECT in the last 72 hours. Thyroid Function Tests: No results for input(s): TSH, T4TOTAL, FREET4, T3FREE, THYROIDAB in the last 72 hours. Anemia Panel: No results for input(s): VITAMINB12, FOLATE, FERRITIN, TIBC, IRON, RETICCTPCT in the last 72 hours. Urine analysis: No results found for: COLORURINE, APPEARANCEUR, LABSPEC, PHURINE, GLUCOSEU, HGBUR, BILIRUBINUR, KETONESUR, PROTEINUR, UROBILINOGEN, NITRITE, LEUKOCYTESUR  Radiological Exams on Admission: DG Chest 2 View  Result Date: 06/12/2020 CLINICAL DATA:  COVID-19 diagnosed 06/01/2020, increasing shortness of breath, hypoxia EXAM: CHEST - 2 VIEW COMPARISON:  None. FINDINGS: Frontal and lateral views of the chest demonstrate an unremarkable cardiac silhouette. Multifocal bilateral airspace disease greatest in the right upper and left lower lobes. No effusion or pneumothorax. No acute bony abnormalities. IMPRESSION: 1. Multifocal bilateral pneumonia compatible with COVID 19. Electronically Signed   By: Sharlet SalinaMichael  Brown M.D.   On: 06/12/2020 20:45   CT Angio Chest PE W/Cm &/Or Wo Cm  Result Date: 06/13/2020 CLINICAL DATA:  Pulmonary embolus suspected with high probability. COVID diagnosis on January 12. Increasing shortness of breath with decreased oxygen saturations. EXAM: CT ANGIOGRAPHY CHEST WITH CONTRAST TECHNIQUE: Multidetector CT imaging of the chest was performed using the standard protocol during bolus administration of intravenous contrast. Multiplanar CT image reconstructions and MIPs were obtained to evaluate  the vascular anatomy. CONTRAST:  100mL OMNIPAQUE IOHEXOL 350 MG/ML SOLN COMPARISON:  Chest radiograph 06/12/2020 FINDINGS: Cardiovascular: Good opacification of the central and segmental pulmonary arteries. No focal filling defects. No evidence of significant pulmonary embolus. Cardiac enlargement. No pericardial effusions. Normal caliber thoracic aorta. No dissection. Great vessel origins are patent. Mediastinum/Nodes: Esophagus is decompressed. Scattered lymph nodes are not pathologically enlarged. Lungs/Pleura: Patchy and confluent alveolar infiltrates in the lungs, most prominent in the periphery. Changes are consistent with COVID pneumonia. No pleural effusions. No pneumothorax. Airways are patent. Upper Abdomen: Diffuse fatty infiltration of the liver. No acute abnormalities demonstrated in the visualized upper abdomen. Musculoskeletal: No chest wall abnormality. No acute or significant osseous findings. Review of the MIP images confirms the above findings. IMPRESSION: 1. No evidence of significant pulmonary embolus. 2. Patchy and confluent alveolar infiltrates in the lungs, most prominent in the periphery. Changes are consistent with COVID pneumonia. 3. Diffuse fatty infiltration of the liver. Electronically  Signed   By: Lucienne Capers M.D.   On: 06/13/2020 00:33     Assessment/Plan 55 year old male with no significant past medical history, unvaccinated against Covid, diagnosed with Covid on 1/12 after having symptoms of fever, sore throat loss of smell and taste and shortness of breath and was initially starting to feel better who states over the past 3 days started feeling worse with acute worsening of shortness of breath on the night of arrival.     Pneumonia due to COVID-19 virus   Acute respiratory failure due to COVID-19 [facility]) -Patient with increased work of breathing requiring 15 L high flow nasal cannula to maintain sats in the low 90s -CTA chest ruled out acute PE -Symptom onset was 1/12  so patient out of window for therapeutic benefit from remdesivir -Baricitinib, methylprednisolone, albuterol, antitussives and vitamins -Supplemental oxygen to keep sats over 92% and proning as tolerated -Follow inflammatory biomarkers    Hyperglycemia due to new onset type 2 diabetes mellitus ([place])   New onset type 2 diabetes mellitus ([place]) -Blood sugar 489 with no prior history of diabetes -Follow A1c -Basal insulin with sliding scale coverage -Patient did receive 5 units IV regular insulin in the emergency room with improvement to the 200s -Diabetic educator consult in the a.m.    DVT prophylaxis: Lovenox  Code Status: full code  Family Communication:  none  Disposition Plan: Back to previous home environment Consults called: none  Status:At the time of admission, it appears that the appropriate admission status for this patient is INPATIENT. This is judged to be reasonable and necessary in order to provide the required intensity of service to ensure the patient's safety given the presenting symptoms, physical exam findings, and initial radiographic and laboratory data in the context of their  Comorbid conditions.   Patient requires inpatient status due to high intensity of service, high risk for further deterioration and high frequency of surveillance required.   I certify that at the point of admission it is my clinical judgment that the patient will require inpatient hospital care spanning beyond [place] MD [facility]     06/13/2020, 1:33 AM

## 2020-06-14 LAB — CBC WITH DIFFERENTIAL/PLATELET
Abs Immature Granulocytes: 0.22 10*3/uL — ABNORMAL HIGH (ref 0.00–0.07)
Basophils Absolute: 0 10*3/uL (ref 0.0–0.1)
Basophils Relative: 0 %
Eosinophils Absolute: 0 10*3/uL (ref 0.0–0.5)
Eosinophils Relative: 0 %
HCT: 38.2 % — ABNORMAL LOW (ref 39.0–52.0)
Hemoglobin: 12.9 g/dL — ABNORMAL LOW (ref 13.0–17.0)
Immature Granulocytes: 2 %
Lymphocytes Relative: 8 %
Lymphs Abs: 0.7 10*3/uL (ref 0.7–4.0)
MCH: 30.1 pg (ref 26.0–34.0)
MCHC: 33.8 g/dL (ref 30.0–36.0)
MCV: 89 fL (ref 80.0–100.0)
Monocytes Absolute: 0.3 10*3/uL (ref 0.1–1.0)
Monocytes Relative: 4 %
Neutro Abs: 8 10*3/uL — ABNORMAL HIGH (ref 1.7–7.7)
Neutrophils Relative %: 86 %
Platelets: 274 10*3/uL (ref 150–400)
RBC: 4.29 MIL/uL (ref 4.22–5.81)
RDW: 11.6 % (ref 11.5–15.5)
WBC: 9.3 10*3/uL (ref 4.0–10.5)
nRBC: 0 % (ref 0.0–0.2)

## 2020-06-14 LAB — COMPREHENSIVE METABOLIC PANEL
ALT: 32 U/L (ref 0–44)
AST: 22 U/L (ref 15–41)
Albumin: 2.8 g/dL — ABNORMAL LOW (ref 3.5–5.0)
Alkaline Phosphatase: 58 U/L (ref 38–126)
Anion gap: 11 (ref 5–15)
BUN: 22 mg/dL — ABNORMAL HIGH (ref 6–20)
CO2: 26 mmol/L (ref 22–32)
Calcium: 9.1 mg/dL (ref 8.9–10.3)
Chloride: 100 mmol/L (ref 98–111)
Creatinine, Ser: 0.65 mg/dL (ref 0.61–1.24)
GFR, Estimated: >60 mL/min (ref >60)
Glucose, Bld: 317 mg/dL — ABNORMAL HIGH (ref 70–99)
Potassium: 4.6 mmol/L (ref 3.5–5.1)
Sodium: 137 mmol/L (ref 135–145)
Total Bilirubin: 0.7 mg/dL (ref 0.3–1.2)
Total Protein: 7.1 g/dL (ref 6.5–8.1)

## 2020-06-14 LAB — HIV ANTIBODY (ROUTINE TESTING W REFLEX): HIV Screen 4th Generation wRfx: NONREACTIVE

## 2020-06-14 LAB — FIBRIN DERIVATIVES D-DIMER (ARMC ONLY): Fibrin derivatives D-dimer (ARMC): 1026 ng/mL (FEU) — ABNORMAL HIGH (ref 0.00–499.00)

## 2020-06-14 LAB — GLUCOSE, CAPILLARY
Glucose-Capillary: 300 mg/dL — ABNORMAL HIGH (ref 70–99)
Glucose-Capillary: 319 mg/dL — ABNORMAL HIGH (ref 70–99)
Glucose-Capillary: 382 mg/dL — ABNORMAL HIGH (ref 70–99)
Glucose-Capillary: 333 mg/dL — ABNORMAL HIGH (ref 70–99)
Glucose-Capillary: 237 mg/dL — ABNORMAL HIGH (ref 70–99)

## 2020-06-14 LAB — C-REACTIVE PROTEIN: CRP: 11.6 mg/dL — ABNORMAL HIGH (ref <1.0)

## 2020-06-14 MED ORDER — INSULIN ASPART 100 UNIT/ML ~~LOC~~ SOLN
8.0000 [IU] | Freq: Three times a day (TID) | SUBCUTANEOUS | Status: DC
  Administered 2020-06-14: 8 [IU] via SUBCUTANEOUS

## 2020-06-14 MED ORDER — POLYETHYLENE GLYCOL 3350 17 G PO PACK
17.0000 g | PACK | Freq: Every day | ORAL | Status: DC
  Administered 2020-06-14 – 2020-06-17 (×3): 17 g via ORAL
  Filled 2020-06-14 (×4): qty 1

## 2020-06-14 MED ORDER — INSULIN DETEMIR 100 UNIT/ML ~~LOC~~ SOLN
0.1400 [IU]/kg | Freq: Two times a day (BID) | SUBCUTANEOUS | Status: DC
  Administered 2020-06-14 (×2): 12 [IU] via SUBCUTANEOUS
  Filled 2020-06-14 (×4): qty 0.12

## 2020-06-14 NOTE — ED Notes (Signed)
Pt playing flat in bed in NAD at this time.

## 2020-06-14 NOTE — Progress Notes (Addendum)
Inpatient Diabetes Program Recommendations  AACE/ADA: New Consensus Statement on Inpatient Glycemic Control   Target Ranges:  Prepandial:   less than 140 mg/dL      Peak postprandial:   less than 180 mg/dL (1-2 hours)      Critically ill patients:  140 - 180 mg/dL   Results for NATANEL, SNAVELY (MRN [id number]) as of 06/14/2020 08:59  Ref. Range 06/13/2020 08:37 06/13/2020 11:51 06/13/2020 17:08 06/13/2020 21:31 06/14/2020 03:15 06/14/2020 07:57  Glucose-Capillary Latest Ref Range: 70 - 99 mg/dL 324 (H) 307 (H) 250 (H) 312 (H) 300 (H) 319 (H)  Results for DEX, BLAKELY (MRN [id number]) as of 06/14/2020 08:59  Ref. Range 06/12/2020 20:29  Hemoglobin A1C Latest Ref Range: 4.8 - 5.6 % 11.0 (H)   Review of Glycemic Control  Diabetes history: No Outpatient Diabetes medications: NA Current orders for Inpatient glycemic control: Levemir 12 units BID, Novolog 0-15 units TID with meals, Novolog 0-5 units QHS, Novolog 4 units TID with meals, Tradjenta 5 mg daily; Solumedrol 41.875 mg Q12H  Inpatient Diabetes Program Recommendations:    Insulin: Noted Levemir increased from 8 units BID to 12 units BID today.  If steroids are continued, please consider increasing meal coverage to Novolog 8 units TID with meals if patient eats at least 50% of meals.  HbgA1C: A1C 11% on 06/12/20 indicating an average glucose of 269 mg/dl over the past 2-3 months.  Addendum 06/14/20@13 :50-Spoke with patient again regarding new DM dx. Patient states he has been reading Living Well with DM book.  Discussed basic pathophysiology of DM Type 2, basic home care, importance of checking CBGs and maintaining good CBG control to prevent long-term and short-term complications. Reviewed glucose and A1C goals.  Discussed impact of nutrition, exercise, stress, sickness, and medications on diabetes control. Discussed impact of steroids on glucose and explained that patient is currently ordered Solumedrol which is contributing to  hyperglycemia. Informed patient that MD has already made some adjustments to insulin today. Explained that it is unclear at this time as to if he will be discharged on insulin or oral DM medications. Informed patient that nursing staff will be educating him on insulin in case he is discharged new to insulin. Patient states that he has given himself 2 insulin injections today and he felt comfortable with self injecting insulin. Discussed Levemir and Novolog insulin, insulin storage, insulin vial versus insulin pens, and site rotation.  Asked patient to review insulin starter kit when received. Also encouraged patient to watch youtube video on how to use an insulin pen and how to use vial/syringe. Discussed glucose monitoring and informed patient that the provider will need to prescribe an glucometer and testing supplies at time of discharge.  Explained how his PCP can use glucose readings to continue to make adjustments with DM medications if needed. Informed patient that outpatient DM education would be ordered (MD cosign required).  Patient verbalized understanding of information discussed and he states that he has no further questions at this time related to diabetes.   RNs to provide ongoing basic DM education at bedside with this patient and engage patient to actively check blood glucose and administer insulin injections (in case he is discharged new to insulin). Will need Rx for: glucose monitoring kit (#[id number]) at time of discharge.   Thanks, Barnie Alderman, RN, MSN, CDE Diabetes Coordinator Inpatient Diabetes Program [phone] (Team Pager from 8am to 5pm)

## 2020-06-14 NOTE — Progress Notes (Signed)
Patient ID: Christian Vazquez, male   DOB: 10-29-1965, 55 y.o.   MRN: [id number] [facility] PROGRESS NOTE  KHOURY SIEMON NWG:[id number] DOB: 1965/09/29 DOA: 06/12/2020 PCP: System, Provider Not In  HPI/Subjective: Patient feeling better today.  Able to take a deeper breath.  Off the 100% nonrebreather.  On 15 L salter high flow nasal cannula this morning and tapered down to 11 L this afternoon.  Admitted with COVID-19 pneumonia and acute hypoxic respiratory failure.  Still with some cough and shortness of breath.  Patient states that he was able to lying on his belly for 4 hours last night and sleep.  Objective: Vitals:   06/14/20 1238 06/14/20 1300  BP: 114/68   Pulse: 88   Resp: 20   Temp: 97.9 F (36.6 C)   SpO2: 90% 93%    Intake/Output Summary (Last 24 hours) at 06/14/2020 1617 Last data filed at 06/14/2020 0315 Gross per 24 hour  Intake 240 ml  Output --  Net 240 ml   Filed Weights   06/12/20 2003 06/13/20 0710 06/14/20 0311  Weight: 83.5 kg 83.5 kg 84.1 kg    ROS: Review of Systems  Respiratory: Positive for cough and shortness of breath.   Cardiovascular: Negative for chest pain.  Gastrointestinal: Negative for abdominal pain, nausea and vomiting.   Exam: Physical Exam HENT:     Head: Normocephalic.     Mouth/Throat:     Pharynx: No oropharyngeal exudate.  Eyes:     General: Lids are normal.     Conjunctiva/sclera: Conjunctivae normal.  Cardiovascular:     Rate and Rhythm: Normal rate and regular rhythm.     Heart sounds: Normal heart sounds, S1 normal and S2 normal.  Pulmonary:     Breath sounds: Examination of the right-middle field reveals decreased breath sounds. Examination of the left-middle field reveals decreased breath sounds. Examination of the right-lower field reveals decreased breath sounds. Examination of the left-lower field reveals decreased breath sounds. Decreased breath sounds present. No wheezing, rhonchi or rales.  Abdominal:      Palpations: Abdomen is soft.     Tenderness: There is no abdominal tenderness.  Musculoskeletal:     Right ankle: No swelling.     Left ankle: No swelling.  Skin:    General: Skin is warm.     Findings: No rash.  Neurological:     Mental Status: He is alert and oriented to person, place, and time.       Data Reviewed: Basic Metabolic Panel: Recent Labs  Lab 06/12/20 2029 06/14/20 0733  NA 132* 137  K 4.9 4.6  CL 92* 100  CO2 24 26  GLUCOSE 489* 317*  BUN 19 22*  CREATININE 0.87 0.65  CALCIUM 9.1 9.1   Liver Function Tests: Recent Labs  Lab 06/14/20 0733  AST 22  ALT 32  ALKPHOS 58  BILITOT 0.7  PROT 7.1  ALBUMIN 2.8*   CBC: Recent Labs  Lab 06/12/20 2029 06/14/20 0733  WBC 8.1 9.3  NEUTROABS  --  8.0*  HGB 13.4 12.9*  HCT 40.0 38.2*  MCV 87.5 89.0  PLT 246 274    CBG: Recent Labs  Lab 06/13/20 1708 06/13/20 2131 06/14/20 0315 06/14/20 0757 06/14/20 1235  GLUCAP 250* 312* 300* 319* 382*     Studies: DG Chest 2 View  Result Date: 06/12/2020 CLINICAL DATA:  COVID-19 diagnosed 06/01/2020, increasing shortness of breath, hypoxia EXAM: CHEST - 2 VIEW COMPARISON:  None. FINDINGS: Frontal and lateral views  of the chest demonstrate an unremarkable cardiac silhouette. Multifocal bilateral airspace disease greatest in the right upper and left lower lobes. No effusion or pneumothorax. No acute bony abnormalities. IMPRESSION: 1. Multifocal bilateral pneumonia compatible with COVID 19. Electronically Signed   By: Randa Ngo M.D.   On: 06/12/2020 20:45   CT Angio Chest PE W/Cm &/Or Wo Cm  Result Date: 06/13/2020 CLINICAL DATA:  Pulmonary embolus suspected with high probability. COVID diagnosis on January 12. Increasing shortness of breath with decreased oxygen saturations. EXAM: CT ANGIOGRAPHY CHEST WITH CONTRAST TECHNIQUE: Multidetector CT imaging of the chest was performed using the standard protocol during bolus administration of intravenous contrast.  Multiplanar CT image reconstructions and MIPs were obtained to evaluate the vascular anatomy. CONTRAST:  155mL OMNIPAQUE IOHEXOL 350 MG/ML SOLN COMPARISON:  Chest radiograph 06/12/2020 FINDINGS: Cardiovascular: Good opacification of the central and segmental pulmonary arteries. No focal filling defects. No evidence of significant pulmonary embolus. Cardiac enlargement. No pericardial effusions. Normal caliber thoracic aorta. No dissection. Great vessel origins are patent. Mediastinum/Nodes: Esophagus is decompressed. Scattered lymph nodes are not pathologically enlarged. Lungs/Pleura: Patchy and confluent alveolar infiltrates in the lungs, most prominent in the periphery. Changes are consistent with COVID pneumonia. No pleural effusions. No pneumothorax. Airways are patent. Upper Abdomen: Diffuse fatty infiltration of the liver. No acute abnormalities demonstrated in the visualized upper abdomen. Musculoskeletal: No chest wall abnormality. No acute or significant osseous findings. Review of the MIP images confirms the above findings. IMPRESSION: 1. No evidence of significant pulmonary embolus. 2. Patchy and confluent alveolar infiltrates in the lungs, most prominent in the periphery. Changes are consistent with COVID pneumonia. 3. Diffuse fatty infiltration of the liver. Electronically Signed   By: Lucienne Capers M.D.   On: 06/13/2020 00:33    Scheduled Meds: . albuterol  2 puff Inhalation Q6H  . vitamin C  500 mg Oral Daily  . baricitinib  4 mg Oral Daily  . enoxaparin (LOVENOX) injection  40 mg Subcutaneous Q24H  . insulin aspart  0-15 Units Subcutaneous TID WC  . insulin aspart  0-5 Units Subcutaneous QHS  . insulin aspart  8 Units Subcutaneous TID WC  . insulin detemir  0.14 Units/kg Subcutaneous BID  . linagliptin  5 mg Oral Daily  . methylPREDNISolone (SOLU-MEDROL) injection  0.5 mg/kg Intravenous Q12H   Followed by  . [START ON 06/16/2020] predniSONE  50 mg Oral Daily  . polyethylene glycol   17 g Oral Daily  . zinc sulfate  220 mg Oral Daily   Continuous Infusions: . remdesivir 100 mg in NS 100 mL 100 mg (06/14/20 1024)   Brief history.  Admitted 06/14/2019 with acute hypoxic respiratory failure and Covid pneumonia.  Started on Solu-Medrol, remdesivir and baricitinib.  Patient initially required 100% nonrebreather and salter high flow nasal cannula 15 L.  Today tapered down to 11 L salter nasal cannula.  Patient also found to be new diabetic with hemoglobin A1c of 11.0.  Assessment/Plan:  1. Acute hypoxic respiratory failure secondary to COVID-19 pneumonia.  Patient tapered from 100% nonrebreather and 15 L salter high flow nasal cannula down to Salter high flow nasal cannula 11 L.  Continue remdesivir, Solu-Medrol, baricitinib. 2. Type 2 diabetes with hyperglycemia new onset.  Nursing staff for diabetic teaching, nutritionist evaluation appreciated.  I spoke with the patient about diabetic diet.  Continue Levemir and short acting insulin.  Will likely need insulin upon disposition.        Code Status:     Code  Status Orders  (From admission, onward)         Start     Ordered   06/13/20 0130  Full code  Continuous        06/13/20 0132        Code Status History    This patient has a current code status but no historical code status.   Advance Care Planning Activity     Family Communication: Spoke with Estill Bamberg on the phone Disposition Plan: Status is: Inpatient  Dispo: The patient is from: Home              Anticipated d/c is to: Home              Anticipated d/c date is: Likely will need 4 more days              Patient currently not stable for disposition requiring too much oxygen   Difficult to place patient.  No, likely will be able to go home   Time spent: 27 minutes  [place]

## 2020-06-14 NOTE — Progress Notes (Signed)
Brief Nutrition Education Note   RD consulted for nutrition education regarding new diabetes diagnosis  Lab Results  Component Value Date   HGBA1C 11.0 (H) 06/12/2020   RD working remotely.  RD unsuccessful at contacting pt via phone today after multiple attempts. Per notes pt has received some education via DM coordinator, nursing staff to continue to provide basic nutrition education bedside and patient aware of outpatient referral made by DM coordinator to [facility].  RD has attached "Carbohydrate Counting for People with Diabetes" handout from the Academy of [employer]etics to discharge instructions. Will attempt to follow-up with patient prior to discharge as able.   Body mass index is 27.36 kg/m. Pt meets criteria for overweight based on current BMI.  Labs and medications reviewed. No further nutrition interventions warranted at this time. RD contact information provided. If additional nutrition issues arise, please re-consult RD.  Lajuan Lines, RD, LDN Clinical Nutrition After Hours/Weekend Pager # in [place]

## 2020-06-15 DIAGNOSIS — E119 Type 2 diabetes mellitus without complications: Secondary | ICD-10-CM

## 2020-06-15 LAB — COMPREHENSIVE METABOLIC PANEL
ALT: 31 U/L (ref 0–44)
AST: 24 U/L (ref 15–41)
Albumin: 2.7 g/dL — ABNORMAL LOW (ref 3.5–5.0)
Alkaline Phosphatase: 54 U/L (ref 38–126)
Anion gap: 8 (ref 5–15)
BUN: 26 mg/dL — ABNORMAL HIGH (ref 6–20)
CO2: 27 mmol/L (ref 22–32)
Calcium: 9 mg/dL (ref 8.9–10.3)
Chloride: 103 mmol/L (ref 98–111)
Creatinine, Ser: 0.62 mg/dL (ref 0.61–1.24)
GFR, Estimated: >60 mL/min (ref >60)
Glucose, Bld: 215 mg/dL — ABNORMAL HIGH (ref 70–99)
Potassium: 4.6 mmol/L (ref 3.5–5.1)
Sodium: 138 mmol/L (ref 135–145)
Total Bilirubin: 0.7 mg/dL (ref 0.3–1.2)
Total Protein: 6.3 g/dL — ABNORMAL LOW (ref 6.5–8.1)

## 2020-06-15 LAB — FIBRIN DERIVATIVES D-DIMER (ARMC ONLY): Fibrin derivatives D-dimer (ARMC): 1083.3 ng/mL (FEU) — ABNORMAL HIGH (ref 0.00–499.00)

## 2020-06-15 LAB — CBC WITH DIFFERENTIAL/PLATELET
Abs Immature Granulocytes: 0.15 10*3/uL — ABNORMAL HIGH (ref 0.00–0.07)
Basophils Absolute: 0 10*3/uL (ref 0.0–0.1)
Basophils Relative: 0 %
Eosinophils Absolute: 0 10*3/uL (ref 0.0–0.5)
Eosinophils Relative: 0 %
HCT: 37.5 % — ABNORMAL LOW (ref 39.0–52.0)
Hemoglobin: 12.7 g/dL — ABNORMAL LOW (ref 13.0–17.0)
Immature Granulocytes: 1 %
Lymphocytes Relative: 8 %
Lymphs Abs: 0.8 10*3/uL (ref 0.7–4.0)
MCH: 29.9 pg (ref 26.0–34.0)
MCHC: 33.9 g/dL (ref 30.0–36.0)
MCV: 88.2 fL (ref 80.0–100.0)
Monocytes Absolute: 0.6 10*3/uL (ref 0.1–1.0)
Monocytes Relative: 5 %
Neutro Abs: 9.1 10*3/uL — ABNORMAL HIGH (ref 1.7–7.7)
Neutrophils Relative %: 86 %
Platelets: 293 10*3/uL (ref 150–400)
RBC: 4.25 MIL/uL (ref 4.22–5.81)
RDW: 11.8 % (ref 11.5–15.5)
WBC: 10.7 10*3/uL — ABNORMAL HIGH (ref 4.0–10.5)
nRBC: 0 % (ref 0.0–0.2)

## 2020-06-15 LAB — GLUCOSE, CAPILLARY
Glucose-Capillary: 199 mg/dL — ABNORMAL HIGH (ref 70–99)
Glucose-Capillary: 333 mg/dL — ABNORMAL HIGH (ref 70–99)
Glucose-Capillary: 294 mg/dL — ABNORMAL HIGH (ref 70–99)
Glucose-Capillary: 308 mg/dL — ABNORMAL HIGH (ref 70–99)

## 2020-06-15 LAB — C-REACTIVE PROTEIN: CRP: 4.6 mg/dL — ABNORMAL HIGH (ref <1.0)

## 2020-06-15 MED ORDER — INSULIN ASPART 100 UNIT/ML ~~LOC~~ SOLN
10.0000 [IU] | Freq: Three times a day (TID) | SUBCUTANEOUS | Status: DC
  Administered 2020-06-15 – 2020-06-17 (×6): 10 [IU] via SUBCUTANEOUS
  Filled 2020-06-15 (×6): qty 1

## 2020-06-15 MED ORDER — INSULIN DETEMIR 100 UNIT/ML ~~LOC~~ SOLN
18.0000 [IU] | Freq: Two times a day (BID) | SUBCUTANEOUS | Status: DC
  Administered 2020-06-15 – 2020-06-16 (×3): 18 [IU] via SUBCUTANEOUS
  Filled 2020-06-15 (×7): qty 0.18

## 2020-06-15 NOTE — Progress Notes (Signed)
[facility] [place] NAME: Kamarrion Stfort    MR#:  [id number]  DATE OF BIRTH:  03-30-1966  SUBJECTIVE:   Patient overall improving. Down to 8 L high flow nasal cannula. Able to lay down in prone position for four hours. Using his spirometer and flutter valve. Eating well.  Patient reports taking his own insulin--taught by RN REVIEW OF SYSTEMS:   Review of Systems  Constitutional: Negative for chills, fever and weight loss.  HENT: Negative for ear discharge, ear pain and nosebleeds.   Eyes: Negative for blurred vision, pain and discharge.  Respiratory: Negative for sputum production, shortness of breath, wheezing and stridor.   Cardiovascular: Negative for chest pain, palpitations, orthopnea and PND.  Gastrointestinal: Negative for abdominal pain, diarrhea, nausea and vomiting.  Genitourinary: Negative for frequency and urgency.  Musculoskeletal: Negative for back pain and joint pain.  Neurological: Negative for sensory change, speech change, focal weakness and weakness.  Psychiatric/Behavioral: Negative for depression and hallucinations. The patient is not nervous/anxious.    Tolerating Diet:yes Tolerating PT: not needed  DRUG ALLERGIES:   Allergies  Allergen Reactions  . Other Rash    Vitamin E, nonoxyl-9, aloe    VITALS:  Blood pressure 109/73, pulse 71, temperature 98.6 F (37 C), temperature source Oral, resp. rate 18, height 5\' 9"  (1.753 m), weight 84.1 kg, SpO2 92 %.  PHYSICAL EXAMINATION:   Physical Exam  GENERAL:  55 y.o.-year-old patient lying in the bed with no acute distress.  HEENT: Head atraumatic, normocephalic. Oropharynx and nasopharynx clear.  NECK:  Supple, no jugular venous distention. No thyroid enlargement, no tenderness.  LUNGS:decreased breath sounds bilaterally, no wheezing, no rales, no rhonchi. No use of accessory muscles of respiration.  CARDIOVASCULAR: S1, S2 normal. No murmurs, rubs, or gallops.   ABDOMEN: Soft, nontender, nondistended. Bowel sounds present. No organomegaly or mass.  EXTREMITIES: No cyanosis, clubbing or edema b/l.    NEUROLOGIC: Cranial nerves II through XII are intact. No focal Motor or sensory deficits b/l.   PSYCHIATRIC:  patient is alert and oriented x 3.  SKIN: No obvious rash, lesion, or ulcer.   LABORATORY PANEL:  CBC Recent Labs  Lab 06/15/20 0651  WBC 10.7*  HGB 12.7*  HCT 37.5*  PLT 293    Chemistries  Recent Labs  Lab 06/15/20 0651  NA 138  K 4.6  CL 103  CO2 27  GLUCOSE 215*  BUN 26*  CREATININE 0.62  CALCIUM 9.0  AST 24  ALT 31  ALKPHOS 54  BILITOT 0.7   Cardiac Enzymes No results for input(s): TROPONINI in the last 168 hours. RADIOLOGY:  No results found. ASSESSMENT AND PLAN:  CLEAVON GOLDMAN is a 55 y.o. male with no significant past medical history, unvaccinated against Covid, diagnosed with Covid on 1/12 after having symptoms of fever, sore throat loss of smell and taste and shortness of breath and was initially starting to feel better who states over the past 3 days started feeling worse with acute worsening of shortness of breath on the night of arrival.  His shortness of breath worsens with minimal exertion.   Acute hypoxic respiratory failure secondary to COVID-19 pneumonia.  --Patient tapered from 100% nonrebreather and 15 L salter high flow nasal cannula down to Salter high flow nasal cannula 11 L--down to 8L HFNC --Continue remdesivir, Solu-Medrol, baricitinib. --CRP trending down -- patient able to say sentences without getting short winded. Some exertional shortness of breath. -- Recommend continue  incentive spirometer and flutter valve. Patient able to be in prone position for about four hours.  Type 2 diabetes with hyperglycemia new onset. This precipitated with steroid   -Nursing staff for diabetic teaching, nutritionist evaluation appreciated. -  patient is educated about diabetic diet.   -Continue Levemir and  short acting insulin.  Will likely need insulin upon disposition.  Procedures: none Family communication : patient tells me family is informed Consults : none CODE STATUS: full DVT Prophylaxis : enoxaparin Level of care: Progressive Cardiac Status is: Inpatient  Remains inpatient appropriate because:Inpatient level of care appropriate due to severity of illness   Dispo: The patient is from: Home              Anticipated d/c is to: Home              Anticipated d/c date is: > 3 days              Patient currently is not medically stable to d/c.   Difficult to place patient No        TOTAL TIME TAKING CARE OF THIS PATIENT: 25 minutes.  >50% time spent on counselling and coordination of care  Note: This dictation was prepared with Dragon dictation along with smaller phrase technology. Any transcriptional errors that result from this process are unintentional.  Fritzi Mandes M.D    [facility]   CC: Primary care physician; System, Provider Not InPatient ID: ERRIK MITCHELLE, male   DOB: 1965/07/05, 55 y.o.   MRN: [id number]

## 2020-06-16 LAB — CBC WITH DIFFERENTIAL/PLATELET
Abs Immature Granulocytes: 0.22 10*3/uL — ABNORMAL HIGH (ref 0.00–0.07)
Basophils Absolute: 0 10*3/uL (ref 0.0–0.1)
Basophils Relative: 0 %
Eosinophils Absolute: 0 10*3/uL (ref 0.0–0.5)
Eosinophils Relative: 0 %
HCT: 35.8 % — ABNORMAL LOW (ref 39.0–52.0)
Hemoglobin: 11.9 g/dL — ABNORMAL LOW (ref 13.0–17.0)
Immature Granulocytes: 2 %
Lymphocytes Relative: 6 %
Lymphs Abs: 0.6 10*3/uL — ABNORMAL LOW (ref 0.7–4.0)
MCH: 29.5 pg (ref 26.0–34.0)
MCHC: 33.2 g/dL (ref 30.0–36.0)
MCV: 88.6 fL (ref 80.0–100.0)
Monocytes Absolute: 0.5 10*3/uL (ref 0.1–1.0)
Monocytes Relative: 5 %
Neutro Abs: 8.7 10*3/uL — ABNORMAL HIGH (ref 1.7–7.7)
Neutrophils Relative %: 87 %
Platelets: 277 10*3/uL (ref 150–400)
RBC: 4.04 MIL/uL — ABNORMAL LOW (ref 4.22–5.81)
RDW: 11.7 % (ref 11.5–15.5)
WBC: 10 10*3/uL (ref 4.0–10.5)
nRBC: 0 % (ref 0.0–0.2)

## 2020-06-16 LAB — COMPREHENSIVE METABOLIC PANEL
ALT: 50 U/L — ABNORMAL HIGH (ref 0–44)
AST: 44 U/L — ABNORMAL HIGH (ref 15–41)
Albumin: 2.6 g/dL — ABNORMAL LOW (ref 3.5–5.0)
Alkaline Phosphatase: 48 U/L (ref 38–126)
Anion gap: 8 (ref 5–15)
BUN: 23 mg/dL — ABNORMAL HIGH (ref 6–20)
CO2: 26 mmol/L (ref 22–32)
Calcium: 8.7 mg/dL — ABNORMAL LOW (ref 8.9–10.3)
Chloride: 103 mmol/L (ref 98–111)
Creatinine, Ser: 0.61 mg/dL (ref 0.61–1.24)
GFR, Estimated: >60 mL/min (ref >60)
Glucose, Bld: 140 mg/dL — ABNORMAL HIGH (ref 70–99)
Potassium: 4.5 mmol/L (ref 3.5–5.1)
Sodium: 137 mmol/L (ref 135–145)
Total Bilirubin: 0.6 mg/dL (ref 0.3–1.2)
Total Protein: 6 g/dL — ABNORMAL LOW (ref 6.5–8.1)

## 2020-06-16 LAB — GLUCOSE, CAPILLARY
Glucose-Capillary: 118 mg/dL — ABNORMAL HIGH (ref 70–99)
Glucose-Capillary: 207 mg/dL — ABNORMAL HIGH (ref 70–99)
Glucose-Capillary: 165 mg/dL — ABNORMAL HIGH (ref 70–99)
Glucose-Capillary: 104 mg/dL — ABNORMAL HIGH (ref 70–99)

## 2020-06-16 LAB — FIBRIN DERIVATIVES D-DIMER (ARMC ONLY): Fibrin derivatives D-dimer (ARMC): 806.91 ng/mL (FEU) — ABNORMAL HIGH (ref 0.00–499.00)

## 2020-06-16 LAB — C-REACTIVE PROTEIN: CRP: 2 mg/dL — ABNORMAL HIGH (ref <1.0)

## 2020-06-16 NOTE — Progress Notes (Signed)
[facility] [place] NAME: Christian Vazquez    MR#:  [id number]  DATE OF BIRTH:  February 08, 1966  SUBJECTIVE:   Patient overall improving. Down to 6 L high flow nasal cannula. Able to lay down in prone position for four hours. Using his spirometer and flutter valve. Eating well.  Patient reports taking his own insulin--taught by RN REVIEW OF SYSTEMS:   Review of Systems  Constitutional: Negative for chills, fever and weight loss.  HENT: Negative for ear discharge, ear pain and nosebleeds.   Eyes: Negative for blurred vision, pain and discharge.  Respiratory: Negative for sputum production, shortness of breath, wheezing and stridor.   Cardiovascular: Negative for chest pain, palpitations, orthopnea and PND.  Gastrointestinal: Negative for abdominal pain, diarrhea, nausea and vomiting.  Genitourinary: Negative for frequency and urgency.  Musculoskeletal: Negative for back pain and joint pain.  Neurological: Negative for sensory change, speech change, focal weakness and weakness.  Psychiatric/Behavioral: Negative for depression and hallucinations. The patient is not nervous/anxious.    Tolerating Diet:yes Tolerating PT: not needed  DRUG ALLERGIES:   Allergies  Allergen Reactions  . Other Rash    Vitamin E, nonoxyl-9, aloe    VITALS:  Blood pressure 94/66, pulse 92, temperature 97.9 F (36.6 C), temperature source Oral, resp. rate 15, height 5\' 9"  (1.753 m), weight 83.9 kg, SpO2 90 %.  PHYSICAL EXAMINATION:   Physical Exam  GENERAL:  55 y.o.-year-old patient lying in the bed with no acute distress.  HEENT: Head atraumatic, normocephalic. Oropharynx and nasopharynx clear.  NECK:  Supple, no jugular venous distention. No thyroid enlargement, no tenderness.  LUNGS:decreased breath sounds bilaterally, no wheezing, no rales, no rhonchi. No use of accessory muscles of respiration.  CARDIOVASCULAR: S1, S2 normal. No murmurs, rubs, or gallops.   ABDOMEN: Soft, nontender, nondistended. Bowel sounds present. No organomegaly or mass.  EXTREMITIES: No cyanosis, clubbing or edema b/l.    NEUROLOGIC: Cranial nerves II through XII are intact. No focal Motor or sensory deficits b/l.   PSYCHIATRIC:  patient is alert and oriented x 3.  SKIN: No obvious rash, lesion, or ulcer.   LABORATORY PANEL:  CBC Recent Labs  Lab 06/16/20 0636  WBC 10.0  HGB 11.9*  HCT 35.8*  PLT 277    Chemistries  Recent Labs  Lab 06/16/20 0636  NA 137  K 4.5  CL 103  CO2 26  GLUCOSE 140*  BUN 23*  CREATININE 0.61  CALCIUM 8.7*  AST 44*  ALT 50*  ALKPHOS 48  BILITOT 0.6   Cardiac Enzymes No results for input(s): TROPONINI in the last 168 hours. RADIOLOGY:  No results found. ASSESSMENT AND PLAN:  Christian Vazquez is a 55 y.o. male with no significant past medical history, unvaccinated against Covid, diagnosed with Covid on 1/12 after having symptoms of fever, sore throat loss of smell and taste and shortness of breath and was initially starting to feel better who states over the past 3 days started feeling worse with acute worsening of shortness of breath on the night of arrival.  His shortness of breath worsens with minimal exertion.   Acute hypoxic respiratory failure secondary to COVID-19 pneumonia.  --Patient tapered from 100% nonrebreather and 15 L salter high flow nasal cannula down to Salter high flow nasal cannula 11 L--down to 6L [place] --Continue remdesivir, Solu-Medrol, baricitinib. --CRP trending down -- patient able to say sentences without getting short winded. Some exertional shortness of breath. -- Recommend continue  incentive spirometer and flutter valve. Patient able to be in prone position for about four hours.  Type 2 diabetes with hyperglycemia new onset. This precipitated with steroid   -Nursing staff for diabetic teaching, nutritionist evaluation appreciated. -  patient is educated about diabetic diet.   -Continue Levemir and  short acting insulin.  Will likely need insulin upon disposition.  Procedures: none Family communication : patient tells me family is informed Consults : none CODE STATUS: full DVT Prophylaxis : enoxaparin Level of care: Progressive Cardiac Status is: Inpatient  Remains inpatient appropriate because:Inpatient level of care appropriate due to severity of illness   Dispo: The patient is from: Home              Anticipated d/c is to: Home              Anticipated d/c date QM:VHQION 1/28              Patient currently is not medically stable to d/c.   Difficult to place patient No        TOTAL TIME TAKING CARE OF THIS PATIENT: 25 minutes.  >50% time spent on counselling and coordination of care  Note: This dictation was prepared with Dragon dictation along with smaller phrase technology. Any transcriptional errors that result from this process are unintentional.  Fritzi Mandes M.D    [facility]   CC: Primary care physician; System, Provider Not InPatient ID: Christian Vazquez, male   DOB: [birth year], 55 y.o.   MRN: [id number]

## 2020-06-17 LAB — FIBRIN DERIVATIVES D-DIMER (ARMC ONLY): Fibrin derivatives D-dimer (ARMC): 1088.74 ng/mL (FEU) — ABNORMAL HIGH (ref 0.00–499.00)

## 2020-06-17 LAB — CBC WITH DIFFERENTIAL/PLATELET
Abs Immature Granulocytes: 0.28 10*3/uL — ABNORMAL HIGH (ref 0.00–0.07)
Basophils Absolute: 0 10*3/uL (ref 0.0–0.1)
Basophils Relative: 0 %
Eosinophils Absolute: 0.2 10*3/uL (ref 0.0–0.5)
Eosinophils Relative: 2 %
HCT: 38.8 % — ABNORMAL LOW (ref 39.0–52.0)
Hemoglobin: 13 g/dL (ref 13.0–17.0)
Immature Granulocytes: 3 %
Lymphocytes Relative: 13 %
Lymphs Abs: 1.4 10*3/uL (ref 0.7–4.0)
MCH: 30 pg (ref 26.0–34.0)
MCHC: 33.5 g/dL (ref 30.0–36.0)
MCV: 89.4 fL (ref 80.0–100.0)
Monocytes Absolute: 0.7 10*3/uL (ref 0.1–1.0)
Monocytes Relative: 6 %
Neutro Abs: 8.1 10*3/uL — ABNORMAL HIGH (ref 1.7–7.7)
Neutrophils Relative %: 76 %
Platelets: 276 10*3/uL (ref 150–400)
RBC: 4.34 MIL/uL (ref 4.22–5.81)
RDW: 11.8 % (ref 11.5–15.5)
WBC: 10.7 10*3/uL — ABNORMAL HIGH (ref 4.0–10.5)
nRBC: 0 % (ref 0.0–0.2)

## 2020-06-17 LAB — COMPREHENSIVE METABOLIC PANEL
ALT: 80 U/L — ABNORMAL HIGH (ref 0–44)
AST: 69 U/L — ABNORMAL HIGH (ref 15–41)
Albumin: 2.7 g/dL — ABNORMAL LOW (ref 3.5–5.0)
Alkaline Phosphatase: 48 U/L (ref 38–126)
Anion gap: 8 (ref 5–15)
BUN: 20 mg/dL (ref 6–20)
CO2: 30 mmol/L (ref 22–32)
Calcium: 8.7 mg/dL — ABNORMAL LOW (ref 8.9–10.3)
Chloride: 103 mmol/L (ref 98–111)
Creatinine, Ser: 0.69 mg/dL (ref 0.61–1.24)
GFR, Estimated: >60 mL/min (ref >60)
Glucose, Bld: 76 mg/dL (ref 70–99)
Potassium: 4.4 mmol/L (ref 3.5–5.1)
Sodium: 141 mmol/L (ref 135–145)
Total Bilirubin: 0.6 mg/dL (ref 0.3–1.2)
Total Protein: 6.1 g/dL — ABNORMAL LOW (ref 6.5–8.1)

## 2020-06-17 LAB — GLUCOSE, CAPILLARY
Glucose-Capillary: 74 mg/dL (ref 70–99)
Glucose-Capillary: 287 mg/dL — ABNORMAL HIGH (ref 70–99)

## 2020-06-17 LAB — C-REACTIVE PROTEIN: CRP: 2.2 mg/dL — ABNORMAL HIGH (ref <1.0)

## 2020-06-17 MED ORDER — LINAGLIPTIN 5 MG PO TABS: 5.0000 mg | ORAL_TABLET | Freq: Every day | ORAL | 0 refills | Status: DC

## 2020-06-17 MED ORDER — ALBUTEROL SULFATE HFA 108 (90 BASE) MCG/ACT IN AERS: 2.0000 | INHALATION_SPRAY | Freq: Four times a day (QID) | RESPIRATORY_TRACT | 0 refills | Status: DC

## 2020-06-17 MED ORDER — INSULIN PEN NEEDLE 32G X 4 MM MISC: 22.0000 [IU] | Freq: Every day | 0 refills | Status: DC

## 2020-06-17 MED ORDER — BLOOD GLUCOSE MONITOR KIT: PACK | 0 refills | Status: AC

## 2020-06-17 MED ORDER — PREDNISONE 10 MG PO TABS: ORAL_TABLET | ORAL | 0 refills | Status: DC

## 2020-06-17 MED ORDER — INSULIN DETEMIR 100 UNIT/ML ~~LOC~~ SOLN: 22.0000 [IU] | Freq: Every day | SUBCUTANEOUS | 3 refills | Status: DC

## 2020-06-17 MED ORDER — GUAIFENESIN-DM 100-10 MG/5ML PO SYRP: 10.0000 mL | ORAL_SOLUTION | ORAL | 0 refills | Status: DC | PRN

## 2020-06-17 NOTE — TOC Transition Note (Signed)
Transition of Care [facility]) - CM/SW Discharge Note   Patient Details  Name: Christian Vazquez MRN: [id number] Date of Birth: 03/21/1966  [facility]) CM/SW Contact:  Eileen Stanford, LCSW Phone Number: 06/17/2020, 10:22 AM   Clinical Narrative:  02 ordered through [employer]. 02 will be delivered at bedside.     Final next level of care: Home/Self Care Barriers to Discharge: No Barriers Identified   Patient Goals and CMS Choice        Discharge Placement                    Patient and family notified of of transfer: 06/17/20  Discharge Plan and Services                DME Arranged: Oxygen DME Agency: [employer] Date DME Agency Contacted: 06/17/20 Time DME Agency Contacted: [phone] Representative spoke with at DME Agency: patricia            Social Determinants of Health ([place]) Interventions     Readmission Risk Interventions No flowsheet data found.

## 2020-06-17 NOTE — Progress Notes (Signed)
SATURATION QUALIFICATIONS: (This note is used to comply with regulatory documentation for home oxygen)  Patient Saturations on Room Air at Rest =88 %  Patient Saturations on Room Air while Ambulating = 87%  Patient Saturations on 4.5 Liters of oxygen while Ambulating = 93%

## 2020-06-17 NOTE — Discharge Instructions (Addendum)
Fingerstick glucose (sugar) goals for home: Before meals: 80-130 mg/dl 2-Hours after meals: less than 180 mg/dl Hemoglobin A1c goal: 7% or less  May want to ask PCP about FreeStyle Libre2 (continuious glucose monitoring sensor).  Carbohydrate Counting For People With Diabetes  Foods with carbohydrates make your blood glucose level go up. Learning how to count carbohydrates can help you control your blood glucose levels. First, identify the foods you eat that contain carbohydrates. Then, using the Foods with Carbohydrates chart, determine about how much carbohydrates are in your meals and snacks. Make sure you are eating foods with fiber, protein, and healthy fat along with your carbohydrate foods. Foods with Carbohydrates The following table shows carbohydrate foods that have about 15 grams of carbohydrate each. Using measuring cups, spoons, or a food scale when you first begin learning about carbohydrate counting can help you learn about the portion sizes you typically eat. The following foods have 15 grams carbohydrate each:  Grains  1 slice bread (1 ounce)   1 small tortilla (6-inch size)    large bagel (1 ounce)   1/3 cup pasta or rice (cooked)    hamburger or hot dog bun ( ounce)    cup cooked cereal    to  cup ready-to-eat cereal   2 taco shells (5-inch size) Fruit  1 small fresh fruit ( to 1 cup)    medium banana   17 small grapes (3 ounces)   1 cup melon or berries    cup canned or frozen fruit   2 tablespoons dried fruit (blueberries, cherries, cranberries, raisins)    cup unsweetened fruit juice  Starchy Vegetables   cup cooked beans, peas, corn, potatoes/sweet potatoes    large baked potato (3 ounces)   1 cup acorn or butternut squash  Snack Foods  3 to 6 crackers   8 potato chips or 13 tortilla chips ( ounce to 1 ounce)   3 cups popped popcorn  Dairy  3/4 cup (6 ounces) nonfat plain yogurt, or yogurt with sugar-free sweetener   1  cup milk   1 cup plain rice, soy, coconut or flavored almond milk Sweets and Desserts   cup ice cream or frozen yogurt   1 tablespoon jam, jelly, pancake syrup, table sugar, or honey   2 tablespoons light pancake syrup   1 inch square of frosted cake or 2 inch square of unfrosted cake   2 small cookies (2/3 ounce each) or  large cookie  Sometimes youll have to estimate carbohydrate amounts if you dont know the exact recipe. One cup of mixed foods like soups can have 1 to 2 carbohydrate servings, while some casseroles might have 2 or more servings of carbohydrate. Foods that have less than 20 calories in each serving can be counted as free foods. Count 1 cup raw vegetables, or  cup cooked non-starchy vegetables as free foods. If you eat 3 or more servings at one meal, then count them as 1 carbohydrate serving.  Foods without Carbohydrates  Not all foods contain carbohydrates. Meat, some dairy, fats, non-starchy vegetables, and many beverages dont contain carbohydrate. So when you count carbohydrates, you can generally exclude chicken, pork, beef, fish, seafood, eggs, tofu, cheese, butter, sour cream, avocado, nuts, seeds, olives, mayonnaise, water, black coffee, unsweetened tea, and zero-calorie drinks. Vegetables with no or low carbohydrate include green beans, cauliflower, tomatoes, and onions. How much carbohydrate should I eat at each meal?  Carbohydrate counting can help you plan your meals and manage your  weight. Following are some starting points for carbohydrate intake at each meal. Work with your registered dietitian nutritionist to find the best range that works for your blood glucose and weight.   To Lose Weight To Maintain Weight  Women 2 - 3 carb servings 3 - 4 carb servings  Men 3 - 4 carb servings 4 - 5 carb servings  Checking your blood glucose after meals will help you know if you need to adjust the timing, type, or number of carbohydrate servings in your meal plan.  Achieve and keep a healthy body weight by balancing your food intake and physical activity.  Tips How should I plan my meals?  Plan for half the food on your plate to include non-starchy vegetables, like salad greens, broccoli, or carrots. Try to eat 3 to 5 servings of non-starchy vegetables every day. Have a protein food at each meal. Protein foods include chicken, fish, meat, eggs, or beans (note that beans contain carbohydrate). These two food groups (non-starchy vegetables and proteins) are low in carbohydrate. If you fill up your plate with these foods, you will eat less carbohydrate but still fill up your stomach. Try to limit your carbohydrate portion to  of the plate.  What fats are healthiest to eat?  Diabetes increases risk for heart disease. To help protect your heart, eat more healthy fats, such as olive oil, nuts, and avocado. Eat less saturated fats like butter, cream, and high-fat meats, like bacon and sausage. Avoid trans fats, which are in all foods that list partially hydrogenated oil as an ingredient. What should I drink?  Choose drinks that are not sweetened with sugar. The healthiest choices are water, carbonated or seltzer waters, and tea and coffee without added sugars.  Sweet drinks will make your blood glucose go up very quickly. One serving of soda or energy drink is  cup. It is best to drink these beverages only if your blood glucose is low.  Artificially sweetened, or diet drinks, typically do not increase your blood glucose if they have zero calories in them. Read labels of beverages, as some diet drinks do have carbohydrate and will raise your blood glucose. Label Reading Tips Read Nutrition Facts labels to find out how many grams of carbohydrate are in a food you want to eat. Dont forget: sometimes serving sizes on the label arent the same as how much food you are going to eat, so you may need to calculate how much carbohydrate is in the food you are serving yourself.    Carbohydrate Counting for People with Diabetes Sample 1-Day Menu  Breakfast  cup yogurt, low fat, low sugar (1 carbohydrate serving)   cup cereal, ready-to-eat, unsweetened (1 carbohydrate serving)  1 cup strawberries (1 carbohydrate serving)   cup almonds ( carbohydrate serving)  Lunch 1, 5 ounce can chunk light tuna  2 ounces cheese, low fat cheddar  6 whole wheat crackers (1 carbohydrate serving)  1 small apple (1 carbohydrate servings)   cup carrots ( carbohydrate serving)   cup snap peas  1 cup 1% milk (1 carbohydrate serving)   Evening Meal Stir fry made with: 3 ounces chicken  1 cup brown rice (3 carbohydrate servings)   cup broccoli ( carbohydrate serving)   cup green beans   cup onions  1 tablespoon olive oil  2 tablespoons teriyaki sauce ( carbohydrate serving)  Evening Snack 1 extra small banana (1 carbohydrate serving)  1 tablespoon peanut butter   Carbohydrate Counting for People with  Diabetes Vegan Sample 1-Day Menu  Breakfast 1 cup cooked oatmeal (2 carbohydrate servings)   cup blueberries (1 carbohydrate serving)  2 tablespoons flaxseeds  1 cup soymilk fortified with calcium and vitamin D  1 cup coffee  Lunch 2 slices whole wheat bread (2 carbohydrate servings)   cup baked tofu   cup lettuce  2 slices tomato  2 slices avocado   cup baby carrots ( carbohydrate serving)  1 orange (1 carbohydrate serving)  1 cup soymilk fortified with calcium and vitamin D   Evening Meal Burrito made with: 1 6-inch corn tortilla (1 carbohydrate serving)  1 cup refried vegetarian beans (2 carbohydrate servings)   cup chopped tomatoes   cup lettuce   cup salsa  1/3 cup brown rice (1 carbohydrate serving)  1 tablespoon olive oil for rice   cup zucchini   Evening Snack 6 small whole grain crackers (1 carbohydrate serving)  2 apricots ( carbohydrate serving)   cup unsalted peanuts ( carbohydrate serving)    Carbohydrate Counting for People with  Diabetes Vegetarian (Lacto-Ovo) Sample 1-Day Menu  Breakfast 1 cup cooked oatmeal (2 carbohydrate servings)   cup blueberries (1 carbohydrate serving)  2 tablespoons flaxseeds  1 egg  1 cup 1% milk (1 carbohydrate serving)  1 cup coffee  Lunch 2 slices whole wheat bread (2 carbohydrate servings)  2 ounces low-fat cheese   cup lettuce  2 slices tomato  2 slices avocado   cup baby carrots ( carbohydrate serving)  1 orange (1 carbohydrate serving)  1 cup unsweetened tea  Evening Meal Burrito made with: 1 6-inch corn tortilla (1 carbohydrate serving)   cup refried vegetarian beans (1 carbohydrate serving)   cup tomatoes   cup lettuce   cup salsa  1/3 cup brown rice (1 carbohydrate serving)  1 tablespoon olive oil for rice   cup zucchini  1 cup 1% milk (1 carbohydrate serving)  Evening Snack 6 small whole grain crackers (1 carbohydrate serving)  2 apricots ( carbohydrate serving)   cup unsalted peanuts ( carbohydrate serving)    Copyright 2020  [employer]y [employer]. All rights reserved.  Using Nutrition Labels: Carbohydrate   Serving Size   Look at the serving size. All the information on the label is based on this portion.  Servings Per Container   The number of servings contained in the package.  Guidelines for Carbohydrate   Look at the total grams of carbohydrate in the serving size.   1 carbohydrate choice = 15 grams of carbohydrate. Range of Carbohydrate Grams Per Choice  Carbohydrate Grams/Choice Carbohydrate Choices  6-10   11-20 1  21-25 1  26-35 2  36-40 2  41-50 3  51-55 3  56-65 4  66-70 4  71-80 5    Copyright 2020  Academy of [employer] and [employer]etics. All rights reser   Keep log of sugars at home to be reviewed by PCP

## 2020-06-17 NOTE — Progress Notes (Signed)
Discharge instructions explained/pt verbalized understanding/ IV removed. Awaiting o2 delivery. Will transport off unit via wheelchair once o2 and ride arrives.

## 2020-06-17 NOTE — Progress Notes (Addendum)
Inpatient Diabetes Program Recommendations  AACE/ADA: New Consensus Statement on Inpatient Glycemic Control (2015)  Target Ranges:  Prepandial:   less than 140 mg/dL      Peak postprandial:   less than 180 mg/dL (1-2 hours)      Critically ill patients:  140 - 180 mg/dL    Results for Christian Vazquez, Christian Vazquez (MRN [id number]) as of 06/17/2020 10:19  Ref. Range 06/16/2020 07:49 06/16/2020 12:17 06/16/2020 16:26 06/16/2020 20:46  Glucose-Capillary Latest Ref Range: 70 - 99 mg/dL 118 (H)  10 units NOVOLOG @9 :29am  18 units LEVEMIR @9 :29am 207 (H)  15 units NOVOLOG @1 :10pm 165 (H)  13 units NOVOLOG @5pm  104 (H)      LEVEMIR HELD b/c pt Refused   Results for Christian Vazquez, Christian Vazquez (MRN [id number]) as of 06/17/2020 10:19  Ref. Range 06/17/2020 08:31  Glucose-Capillary Latest Ref Range: 70 - 99 mg/dL 74    New Diagnosis of Diabetes  Current Orders: Levemir 18 units BID      Novolog Moderate Correction Scale/ SSI (0-15 units) TID AC + HS      Novolog 10 units TID with meals      Tradjenta 5 mg Daily     Prednisone 50 mg Daily     MD- Please note that pt refused to take 18 units Levemir last PM.  CBG only 74 this AM with only 18 units total Levemir on board from yesterday AM (pt did take AM dose yest)  Please consider reduction of Levemir to 10 units BID (0.25 units/kg)  Diabetes Coordinator spoke w/ pt yesterday (01/27) regarding new diagnosis   Addendum 12:40pm--Met w/ pt again at bedside today to discuss new diagnosis of diabetes.  Re-reviewed current A1c, goal CBGs and goal A1c for home, diabetes basics info.  Discussed going home on Levemir once per day and Tradjenta.  Explained what each med is, how they work, when to take, etc.  Allowed pt time for questions and all questions answered.  Strongly encouraged pt to follow up with PCP after discharge for further diabetes management.  Encouraged pt to check his CBGs at least TID before meals and occasionally 2 hours after a meal at home  for the next 2-3 weeks so he can review the CBG data with his PCP and so the PCP can make medication adjustments if needed.  Educated patient on insulin pen for home.  Reviewed all steps of insulin pen including attachment of needle, 2-unit air shot, dialing up dose, giving injection, rotation of injection sites, removing needle, disposal of sharps, storage of unused insulin, disposal of insulin etc.  Patient able to provide successful return demonstration.  Reviewed troubleshooting with insulin pen.     --Will follow patient during hospitalization--  Wyn Quaker RN, MSN, CDE Diabetes Coordinator Inpatient Glycemic Control Team Team Pager: [phone] (8a-5p)

## 2020-06-17 NOTE — Discharge Summary (Signed)
[name] NAME: Christian Vazquez    MR#:  [id number]  DATE OF BIRTH:  [birth year]  DATE OF ADMISSION:  06/12/2020 ADMITTING PHYSICIAN: Athena Masse, MD  DATE OF DISCHARGE: 06/17/2020  PRIMARY CARE PHYSICIAN: Remi Haggard, FNP    ADMISSION DIAGNOSIS:  Hyperglycemia [R73.9] Acute respiratory failure due to COVID-19 (HCC) [U07.1, J96.00] Acute hypoxemic respiratory failure due to COVID-19 (HCC) [U07.1, J96.01]  DISCHARGE DIAGNOSIS:   Acute hypoxemic respiratory failure due to COVID-19 (HCC) [U07.1, J96.01] Type 2 DM with hyperglycemia--new onset SECONDARY DIAGNOSIS:   Past Medical History:  Diagnosis Date  . Hypercholesteremia     HOSPITAL COURSE:   Christian Vazquez a 55 y.o.malewithno significant past medical history, unvaccinated against Covid, diagnosed with Covid on 1/12 after having symptoms of fever, sore throat loss of smell and taste and shortness of breath and was initially starting to feel better who states over the past 3 days started feeling worse with acute worsening of shortness of breath on the night of arrival. His shortness of breath worsens with minimal exertion.   Acute hypoxic respiratory failure secondary to COVID-19 pneumonia.  --Patient tapered from 100% nonrebreather and 15 L salter high flow nasal cannula down to Salter high flow nasal cannula 11 L--down to 4.5L [place] --Completed remdesivir -- Solu-Medrol change to po steroids --received  baricitinib. --CRP trending down -- patient able to say sentences without getting short winded. Some exertional shortness of breath. -- Recommend continue incentive spirometer and flutter valve. Patient able to be in prone position for about four hours. --overall improving  Type 2 diabetes with hyperglycemia new onset. This precipitated with steroid  -Nursing staff for diabetic teaching, nutritionist evaluation appreciated. - patient is educated about  diabetic diet.  -Continue Levemir and linagliptin    Procedures: none Family communication : patient tells me family is informed Consults : none CODE STATUS: full DVT Prophylaxis : enoxaparin Level of care: Progressive Cardiac Status is: Inpatient  Will d/c home with oxygen Pt will f/u PCP  CONSULTS OBTAINED:    DRUG ALLERGIES:   Allergies  Allergen Reactions  . Other Rash    Vitamin E, nonoxyl-9, aloe    DISCHARGE MEDICATIONS:   Allergies as of 06/17/2020      Reactions   Other Rash   Vitamin E, nonoxyl-9, aloe      Medication List    TAKE these medications   albuterol 108 (90 Base) MCG/ACT inhaler Commonly known as: VENTOLIN HFA Inhale 2 puffs into the lungs every 6 (six) hours.   blood glucose meter kit and supplies Kit Dispense based on patient and insurance preference. Use up to four times daily as directed. (FOR ICD-9 250.00, 250.01).   DSS 100 MG Caps Take 100 mg by mouth daily as needed.   guaiFENesin-dextromethorphan 100-10 MG/5ML syrup Commonly known as: ROBITUSSIN DM Take 10 mLs by mouth every 4 (four) hours as needed for cough.   insulin detemir 100 UNIT/ML injection Commonly known as: LEVEMIR Inject 0.22 mLs (22 Units total) into the skin daily.   linagliptin 5 MG Tabs tablet Commonly known as: TRADJENTA Take 1 tablet (5 mg total) by mouth daily. Start taking on: June 18, 2020   Omega-3 1000 MG Caps Take 1,000 mg by mouth daily.   predniSONE 10 MG tablet Commonly known as: DELTASONE Take 50 mg daily---taper by 10 mg daily then stop Start taking on: June 18, 2020   sildenafil 20 MG tablet Commonly known  as: REVATIO Take 20 mg by mouth See admin instructions. Take 2-5 tablets by mouth as directed.   simvastatin 20 MG tablet Commonly known as: ZOCOR Take 20 mg by mouth every evening.   Vitamin D-3 25 MCG (1000 UT) Caps Take 1,000 Units by mouth daily.            Durable Medical Equipment  (From admission, onward)          Start     Ordered   06/17/20 1017  For home use only DME oxygen  Once       Question Answer Comment  Length of Need 6 Months   Mode or (Route) Nasal cannula   Liters per Minute 4.5   Oxygen delivery system Gas      06/17/20 1016   06/16/20 1423  For home use only DME oxygen  Once       Question Answer Comment  Length of Need 6 Months   Mode or (Route) Nasal cannula   Liters per Minute 5   Frequency Continuous (stationary and portable oxygen unit needed)   Oxygen conserving device Yes   Oxygen delivery system Gas      06/16/20 1423          If you experience worsening of your admission symptoms, develop shortness of breath, life threatening emergency, suicidal or homicidal thoughts you must seek medical attention immediately by calling 911 or calling your MD immediately  if symptoms less severe.  You Must read complete instructions/literature along with all the possible adverse reactions/side effects for all the Medicines you take and that have been prescribed to you. Take any new Medicines after you have completely understood and accept all the possible adverse reactions/side effects.   Please note  You were cared for by a hospitalist during your hospital stay. If you have any questions about your discharge medications or the care you received while you were in the hospital after you are discharged, you can call the unit and asked to speak with the hospitalist on call if the hospitalist that took care of you is not available. Once you are discharged, your primary care physician will handle any further medical issues. Please note that NO REFILLS for any discharge medications will be authorized once you are discharged, as it is imperative that you return to your primary care physician (or establish a relationship with a primary care physician if you do not have one) for your aftercare needs so that they can reassess your need for medications and monitor your lab values. Today    SUBJECTIVE   Feeling good I am getting bored!   VITAL SIGNS:  Blood pressure 113/75, pulse 83, temperature 98.8 F (37.1 C), temperature source Oral, resp. rate 18, height 5' 9"  (1.753 m), weight 83.9 kg, SpO2 92 %.  I/O:    Intake/Output Summary (Last 24 hours) at 06/17/2020 1029 Last data filed at 06/17/2020 0531 Gross per 24 hour  Intake --  Output 300 ml  Net -300 ml    PHYSICAL EXAMINATION:  GENERAL:  55 y.o.-year-old patient lying in the bed with no acute distress.  LUNGS: Normal breath sounds bilaterally, no wheezing, rales,rhonchi or crepitation. No use of accessory muscles of respiration.  CARDIOVASCULAR: S1, S2 normal. No murmurs, rubs, or gallops.  ABDOMEN: Soft, non-tender, non-distended. Bowel sounds present. No organomegaly or mass.  EXTREMITIES: No pedal edema, cyanosis, or clubbing.  PSYCHIATRIC: The patient is alert and oriented x 3.  SKIN: No obvious rash, lesion, or  ulcer.   DATA REVIEW:   CBC  Recent Labs  Lab 06/17/20 0706  WBC 10.7*  HGB 13.0  HCT 38.8*  PLT 276    Chemistries  Recent Labs  Lab 06/17/20 0706  NA 141  K 4.4  CL 103  CO2 30  GLUCOSE 76  BUN 20  CREATININE 0.69  CALCIUM 8.7*  AST 69*  ALT 80*  ALKPHOS 48  BILITOT 0.6    Microbiology Results   No results found for this or any previous visit (from the past 240 hour(s)).  RADIOLOGY:  No results found.   CODE STATUS:     Code Status Orders  (From admission, onward)         Start     Ordered   06/13/20 0130  Full code  Continuous        06/13/20 0132        Code Status History    This patient has a current code status but no historical code status.   Advance Care Planning Activity       TOTAL TIME TAKING CARE OF THIS PATIENT: *35 minutes.    Fritzi Mandes M.D  [facility]    CC: Primary care physician; Remi Haggard, FNP

## 2020-08-19 ENCOUNTER — Other Ambulatory Visit: Payer: Self-pay

## 2020-08-19 ENCOUNTER — Other Ambulatory Visit: Admission: RE | Admit: 2020-08-19 | Payer: Managed Care, Other (non HMO) | Source: Ambulatory Visit

## 2020-08-23 ENCOUNTER — Ambulatory Visit: Payer: No Typology Code available for payment source | Admitting: Anesthesiology

## 2020-08-23 ENCOUNTER — Ambulatory Visit
Admission: RE | Admit: 2020-08-23 | Discharge: 2020-08-23 | Disposition: A | Discharge status: Home / Self Care | Payer: No Typology Code available for payment source | Source: Ambulatory Visit | Attending: Gastroenterology | Admitting: Gastroenterology

## 2020-08-23 ENCOUNTER — Encounter
Admission: RE | Disposition: A | Discharge status: Home / Self Care | Payer: Self-pay | Source: Ambulatory Visit | Attending: Gastroenterology

## 2020-08-23 ENCOUNTER — Other Ambulatory Visit: Payer: Self-pay

## 2020-08-23 DIAGNOSIS — E7800 Pure hypercholesterolemia, unspecified: Secondary | ICD-10-CM | POA: Insufficient documentation

## 2020-08-23 DIAGNOSIS — K640 First degree hemorrhoids: Secondary | ICD-10-CM | POA: Diagnosis not present

## 2020-08-23 DIAGNOSIS — Z8616 Personal history of COVID-19: Secondary | ICD-10-CM | POA: Insufficient documentation

## 2020-08-23 DIAGNOSIS — Z1211 Encounter for screening for malignant neoplasm of colon: Secondary | ICD-10-CM | POA: Diagnosis not present

## 2020-08-23 DIAGNOSIS — Z79899 Other long term (current) drug therapy: Secondary | ICD-10-CM | POA: Insufficient documentation

## 2020-08-23 DIAGNOSIS — Z794 Long term (current) use of insulin: Secondary | ICD-10-CM | POA: Insufficient documentation

## 2020-08-23 DIAGNOSIS — Z7984 Long term (current) use of oral hypoglycemic drugs: Secondary | ICD-10-CM | POA: Insufficient documentation

## 2020-08-23 DIAGNOSIS — K5730 Diverticulosis of large intestine without perforation or abscess without bleeding: Secondary | ICD-10-CM | POA: Insufficient documentation

## 2020-08-23 DIAGNOSIS — E119 Type 2 diabetes mellitus without complications: Secondary | ICD-10-CM | POA: Insufficient documentation

## 2020-08-23 HISTORY — PX: COLONOSCOPY WITH PROPOFOL: SHX5780

## 2020-08-23 HISTORY — DX: COVID-19: U07.1

## 2020-08-23 HISTORY — DX: Type 2 diabetes mellitus without complications: E11.9

## 2020-08-23 LAB — GLUCOSE, CAPILLARY: Glucose-Capillary: 108 mg/dL — ABNORMAL HIGH (ref 70–99)

## 2020-08-23 SURGERY — COLONOSCOPY WITH PROPOFOL
Anesthesia: General

## 2020-08-23 MED ORDER — PROPOFOL 10 MG/ML IV BOLUS
INTRAVENOUS | Status: DC | PRN
  Administered 2020-08-23: 20 mg via INTRAVENOUS
  Administered 2020-08-23: 50 mg via INTRAVENOUS
  Administered 2020-08-23: 20 mg via INTRAVENOUS
  Administered 2020-08-23: 50 mg via INTRAVENOUS
  Administered 2020-08-23 (×4): 20 mg via INTRAVENOUS

## 2020-08-23 MED ORDER — LIDOCAINE HCL (CARDIAC) PF 100 MG/5ML IV SOSY
PREFILLED_SYRINGE | INTRAVENOUS | Status: DC | PRN
  Administered 2020-08-23: 20 mg via INTRAVENOUS

## 2020-08-23 MED ORDER — PROPOFOL 500 MG/50ML IV EMUL
INTRAVENOUS | Status: AC
  Filled 2020-08-23: qty 50

## 2020-08-23 MED ORDER — SODIUM CHLORIDE 0.9 % IV SOLN: INTRAVENOUS | Status: DC

## 2020-08-23 NOTE — Transfer of Care (Signed)
Immediate Anesthesia Transfer of Care Note  Patient: Christian Vazquez  Procedure(s) Performed: COLONOSCOPY WITH PROPOFOL (N/A )  Patient Location: PACU and Endoscopy Unit  Anesthesia Type:MAC and General  Level of Consciousness: awake, alert  and oriented  Airway & Oxygen Therapy: Patient Spontanous Breathing  Post-op Assessment: Report given to RN and Post -op Vital signs reviewed and stable  Post vital signs: Reviewed and stable  Last Vitals:  Vitals Value Taken Time  BP 102/61 08/23/20 0940  Temp 36.6 C 08/23/20 0940  Pulse 72 08/23/20 0941  Resp 15 08/23/20 0941  SpO2 98 % 08/23/20 0941  Vitals shown include unvalidated device data.  Last Pain:  Vitals:   08/23/20 0854  TempSrc: Temporal  PainSc: 0-No pain         Complications: No complications documented.

## 2020-08-23 NOTE — Anesthesia Preprocedure Evaluation (Signed)
Anesthesia Evaluation  Patient identified by MRN, date of birth, ID band Patient awake    Reviewed: Allergy & Precautions, NPO status , Patient's Chart, lab work & pertinent test results  History of Anesthesia Complications Negative for: history of anesthetic complications  Airway Mallampati: II  TM Distance: >3 FB Neck ROM: Full    Dental  (+) Chipped   Pulmonary neg pulmonary ROS, neg sleep apnea, neg COPD,    breath sounds clear to auscultation- rhonchi (-) wheezing      Cardiovascular Exercise Tolerance: Good (-) hypertension(-) CAD, (-) Past MI, (-) Cardiac Stents and (-) CABG  Rhythm:Regular Rate:Normal - Systolic murmurs and - Diastolic murmurs    Neuro/Psych neg Seizures negative neurological ROS  negative psych ROS   GI/Hepatic negative GI ROS, Neg liver ROS,   Endo/Other  diabetes, Insulin Dependent  Renal/GU negative Renal ROS     Musculoskeletal negative musculoskeletal ROS (+)   Abdominal (+) - obese,   Peds  Hematology negative hematology ROS (+)   Anesthesia Other Findings Past Medical History: No date: COVID-19 No date: Diabetes mellitus without complication (HCC) No date: Hypercholesteremia   Reproductive/Obstetrics                             Anesthesia Physical Anesthesia Plan  ASA: II  Anesthesia Plan: General   Post-op Pain Management:    Induction: Intravenous  PONV Risk Score and Plan: 1 and Propofol infusion  Airway Management Planned: Natural Airway  Additional Equipment:   Intra-op Plan:   Post-operative Plan:   Informed Consent: I have reviewed the patients History and Physical, chart, labs and discussed the procedure including the risks, benefits and alternatives for the proposed anesthesia with the patient or authorized representative who has indicated his/her understanding and acceptance.     Dental advisory given  Plan Discussed with:  CRNA and Anesthesiologist  Anesthesia Plan Comments:         Anesthesia Quick Evaluation

## 2020-08-23 NOTE — H&P (Signed)
Outpatient short stay form Pre-procedure 08/23/2020 9:15 AM Raylene Miyamoto MD, MPH  Primary Physician: FNP Lavena Bullion  Reason for visit:  Surveillance colonoscopy  History of present illness:   55 y/o gentleman with history of hypertension here for surveillance colonoscopy. No blood thinners. No abdominal surgeries. No family history of GI malignancies. No significant GI symptoms.    Current Facility-Administered Medications:  .  0.9 %  sodium chloride infusion, , Intravenous, Continuous, Kong Packett, Hilton Cork, MD, Last Rate: 20 mL/hr at 08/23/20 0910, New Bag at 08/23/20 0910  Medications Prior to Admission  Medication Sig Dispense Refill Last Dose  . Cholecalciferol (VITAMIN D-3) 25 MCG (1000 UT) CAPS Take 1,000 Units by mouth daily.   Past Week at Unknown time  . insulin detemir (LEVEMIR) 100 UNIT/ML injection Inject 0.22 mLs (22 Units total) into the skin daily. 10 mL 3 08/22/2020 at Unknown time  . LISINOPRIL PO Take by mouth daily.   08/22/2020 at Unknown time  . metFORMIN (GLUCOPHAGE) 500 MG tablet Take by mouth 2 (two) times daily with a meal.   08/22/2020 at Unknown time  . Omega-3 1000 MG CAPS Take 1,000 mg by mouth daily.   Past Week at Unknown time  . albuterol (VENTOLIN HFA) 108 (90 Base) MCG/ACT inhaler Inhale 2 puffs into the lungs every 6 (six) hours. 1 each 0   . blood glucose meter kit and supplies KIT Dispense based on patient and insurance preference. Use up to four times daily as directed. (FOR ICD-9 250.00, 250.01). 1 each 0   . Docusate Sodium (DSS) 100 MG CAPS Take 100 mg by mouth daily as needed.     . Insulin Pen Needle 32G X 4 MM MISC Inject 22 Units into the skin daily. 100 each 0   . linagliptin (TRADJENTA) 5 MG TABS tablet Take 1 tablet (5 mg total) by mouth daily. 30 tablet 0   . sildenafil (REVATIO) 20 MG tablet Take 20 mg by mouth See admin instructions. Take 2-5 tablets by mouth as directed.        Allergies  Allergen Reactions  . Other Rash    Vitamin E,  nonoxyl-9, aloe     Past Medical History:  Diagnosis Date  . COVID-19   . Diabetes mellitus without complication ([place])   . Hypercholesteremia     Review of systems:  Otherwise negative.    Physical Exam  Gen: Alert, oriented. Appears stated age.  HEENT: PERRLA. Lungs: No respiratory distress CV: RRR Abd: soft, benign, no masses Ext: No edema    Planned procedures: Proceed with colonoscopy. The patient understands the nature of the planned procedure, indications, risks, alternatives and potential complications including but not limited to bleeding, infection, perforation, damage to internal organs and possible oversedation/side effects from anesthesia. The patient agrees and gives consent to proceed.  Please refer to procedure notes for findings, recommendations and patient disposition/instructions.     Raylene Miyamoto MD, MPH Gastroenterology 08/23/2020  9:15 AM

## 2020-08-23 NOTE — Anesthesia Postprocedure Evaluation (Signed)
Anesthesia Post Note  Patient: DANDY LAZARO  Procedure(s) Performed: COLONOSCOPY WITH PROPOFOL (N/A )  Patient location during evaluation: Endoscopy Anesthesia Type: General Level of consciousness: awake and alert and oriented Pain management: pain level controlled Vital Signs Assessment: post-procedure vital signs reviewed and stable Respiratory status: spontaneous breathing, nonlabored ventilation and respiratory function stable Cardiovascular status: blood pressure returned to baseline and stable Postop Assessment: no signs of nausea or vomiting Anesthetic complications: no   No complications documented.   Last Vitals:  Vitals:   08/23/20 1000 08/23/20 1010  BP: 102/68 101/65  Pulse: 82 67  Resp: 20 (!) 22  Temp:    SpO2: 97% 98%    Last Pain:  Vitals:   08/23/20 0854  TempSrc: Temporal  PainSc: 0-No pain                 Robena Ewy

## 2020-08-23 NOTE — Op Note (Signed)
[facility] Gastroenterology Patient Name: Christian Vazquez Procedure Date: 08/23/2020 9:16 AM MRN: [id number] Account #: [id number] Date of Birth: 1965-06-23 Admit Type: Outpatient Age: 55 Room: [facility] ENDO ROOM 1 Gender: Male Note Status: Finalized Procedure:             Colonoscopy Indications:           High risk colon cancer surveillance: Personal history                         of colonic polyps Providers:             Andrey Farmer MD, MD Referring MD:          Jordan Likes. Lavena Bullion (Referring MD) Medicines:             Monitored Anesthesia Care Complications:         No immediate complications. Procedure:             Pre-Anesthesia Assessment:                        - Prior to the procedure, a History and Physical was                         performed, and patient medications and allergies were                         reviewed. The patient is competent. The risks and                         benefits of the procedure and the sedation options and                         risks were discussed with the patient. All questions                         were answered and informed consent was obtained.                         Patient identification and proposed procedure were                         verified by the physician, the nurse, the anesthetist                         and the technician in the endoscopy suite. Mental                         Status Examination: alert and oriented. Airway                         Examination: normal oropharyngeal airway and neck                         mobility. Respiratory Examination: clear to                         auscultation. CV Examination: normal. Prophylactic  Antibiotics: The patient does not require prophylactic                         antibiotics. Prior Anticoagulants: The patient has                         taken no previous anticoagulant or antiplatelet                         agents. ASA Grade  Assessment: II - A patient with mild                         systemic disease. After reviewing the risks and                         benefits, the patient was deemed in satisfactory                         condition to undergo the procedure. The anesthesia                         plan was to use monitored anesthesia care (MAC).                         Immediately prior to administration of medications,                         the patient was re-assessed for adequacy to receive                         sedatives. The heart rate, respiratory rate, oxygen                         saturations, blood pressure, adequacy of pulmonary                         ventilation, and response to care were monitored                         throughout the procedure. The physical status of the                         patient was re-assessed after the procedure.                        After obtaining informed consent, the colonoscope was                         passed under direct vision. Throughout the procedure,                         the patient's blood pressure, pulse, and oxygen                         saturations were monitored continuously. The                         Colonoscope was introduced through the anus and  advanced to the the cecum, identified by appendiceal                         orifice and ileocecal valve. The colonoscopy was                         performed without difficulty. The patient tolerated                         the procedure well. The quality of the bowel                         preparation was good. Findings:      The perianal and digital rectal examinations were normal.      A few small and large-mouthed diverticula were found in the sigmoid       colon, descending colon and ascending colon.      Internal hemorrhoids were found during retroflexion. The hemorrhoids       were Grade I (internal hemorrhoids that do not prolapse).      The exam was  otherwise without abnormality on direct and retroflexion       views. Impression:            - Diverticulosis in the sigmoid colon, in the                         descending colon and in the ascending colon.                        - Internal hemorrhoids.                        - The examination was otherwise normal on direct and                         retroflexion views.                        - No specimens collected. Recommendation:        - Discharge patient to home.                        - Resume previous diet.                        - Continue present medications.                        - Repeat colonoscopy in 10 years for surveillance.                        - Return to referring physician as previously                         scheduled. Procedure Code(s):     --- Professional ---                        B6389, Colorectal cancer screening; colonoscopy on                         individual at  high risk Diagnosis Code(s):     --- Professional ---                        Z86.010, Personal history of colonic polyps                        K64.0, First degree hemorrhoids                        K57.30, Diverticulosis of large intestine without                         perforation or abscess without bleeding CPT copyright 2019 American Medical Association. All rights reserved. The codes documented in this report are preliminary and upon coder review may  be revised to meet current compliance requirements. Andrey Farmer MD, MD 08/23/2020 9:38:59 AM Number of Addenda: 0 Note Initiated On: 08/23/2020 9:16 AM Scope Withdrawal Time: 0 hours 8 minutes 25 seconds  Total Procedure Duration: 0 hours 12 minutes 24 seconds  Estimated Blood Loss:  Estimated blood loss: none.      [facility]

## 2020-08-23 NOTE — Interval H&P Note (Signed)
History and Physical Interval Note:  08/23/2020 9:17 AM  Christian Vazquez  has presented today for surgery, with the diagnosis of HX.OF COLON POLYPS.  The various methods of treatment have been discussed with the patient and family. After consideration of risks, benefits and other options for treatment, the patient has consented to  Procedure(s) with comments: COLONOSCOPY WITH PROPOFOL (N/A) - covid positive 06/01/2020 as a surgical intervention.  The patient's history has been reviewed, patient examined, no change in status, stable for surgery.  I have reviewed the patient's chart and labs.  Questions were answered to the patient's satisfaction.     Lesly Rubenstein  Ok to proceed with colonoscopy

## 2022-03-06 ENCOUNTER — Encounter: Payer: Self-pay | Admitting: Dermatology

## 2022-03-06 ENCOUNTER — Ambulatory Visit (INDEPENDENT_AMBULATORY_CARE_PROVIDER_SITE_OTHER): Payer: BC Managed Care – PPO | Admitting: Dermatology

## 2022-03-06 DIAGNOSIS — L578 Other skin changes due to chronic exposure to nonionizing radiation: Secondary | ICD-10-CM | POA: Diagnosis not present

## 2022-03-06 DIAGNOSIS — L814 Other melanin hyperpigmentation: Secondary | ICD-10-CM

## 2022-03-06 DIAGNOSIS — D492 Neoplasm of unspecified behavior of bone, soft tissue, and skin: Secondary | ICD-10-CM

## 2022-03-06 DIAGNOSIS — C44629 Squamous cell carcinoma of skin of left upper limb, including shoulder: Secondary | ICD-10-CM

## 2022-03-06 DIAGNOSIS — L820 Inflamed seborrheic keratosis: Secondary | ICD-10-CM | POA: Diagnosis not present

## 2022-03-06 DIAGNOSIS — C4492 Squamous cell carcinoma of skin, unspecified: Secondary | ICD-10-CM

## 2022-03-06 DIAGNOSIS — L738 Other specified follicular disorders: Secondary | ICD-10-CM

## 2022-03-06 DIAGNOSIS — L821 Other seborrheic keratosis: Secondary | ICD-10-CM

## 2022-03-06 DIAGNOSIS — Z1283 Encounter for screening for malignant neoplasm of skin: Secondary | ICD-10-CM | POA: Diagnosis not present

## 2022-03-06 DIAGNOSIS — D1801 Hemangioma of skin and subcutaneous tissue: Secondary | ICD-10-CM

## 2022-03-06 HISTORY — DX: Squamous cell carcinoma of skin, unspecified: C44.92

## 2022-03-06 NOTE — Progress Notes (Signed)
New Patient Visit  Subjective  Christian Vazquez is a 56 y.o. male who presents for the following: check spot (L arm, ~28m no symptoms/Chest, yrs, no symptoms/Face, scaly spots). The patient presents for Upper Body Skin Exam (UBSE) for skin cancer screening and mole check.  The patient has spots, moles and lesions to be evaluated, some may be new or changing and the patient has concerns that these could be cancer.  The following portions of the chart were reviewed this encounter and updated as appropriate:   Tobacco  Allergies  Meds  Problems  Med Hx  Surg Hx  Fam Hx     Review of Systems:  No other skin or systemic complaints except as noted in HPI or Assessment and Plan.  Objective  Well appearing patient in no apparent distress; mood and affect are within normal limits.  All skin waist up examined.  Left Forearm - Posterior Cutaneous horn 0.6cm     R chest parasternal x 2, back/shoulders x 4, L arm x 2, R arm x 1 (9) Stuck on waxy paps with erythema   Assessment & Plan  Neoplasm of skin Left Forearm - Posterior Epidermal / dermal shaving  Lesion diameter (cm):  0.6 Informed consent: discussed and consent obtained   Timeout: patient name, date of birth, surgical site, and procedure verified   Procedure prep:  Patient was prepped and draped in usual sterile fashion Prep type:  Isopropyl alcohol Anesthesia: the lesion was anesthetized in a standard fashion   Anesthetic:  1% lidocaine w/ epinephrine 1-100,000 buffered w/ 8.4% NaHCO3 Instrument used: flexible razor blade   Hemostasis achieved with: pressure, aluminum chloride and electrodesiccation   Outcome: patient tolerated procedure well   Post-procedure details: sterile dressing applied and wound care instructions given   Dressing type: bandage and bacitracin    Destruction of lesion Complexity: extensive   Destruction method: electrodesiccation and curettage   Informed consent: discussed and consent  obtained   Timeout:  patient name, date of birth, surgical site, and procedure verified Procedure prep:  Patient was prepped and draped in usual sterile fashion Prep type:  Isopropyl alcohol Anesthesia: the lesion was anesthetized in a standard fashion   Anesthetic:  1% lidocaine w/ epinephrine 1-100,000 buffered w/ 8.4% NaHCO3 Curettage performed in three different directions: Yes   Electrodesiccation performed over the curetted area: Yes   Lesion length (cm):  0.6 Lesion width (cm):  0.6 Margin per side (cm):  0.2 Final wound size (cm):  1 Hemostasis achieved with:  pressure, aluminum chloride and electrodesiccation Outcome: patient tolerated procedure well with no complications   Post-procedure details: sterile dressing applied and wound care instructions given   Dressing type: bandage and bacitracin    Specimen 1 - Surgical pathology Differential Diagnosis: D48.5 ISK vs Wart vs AK r/o SCC  Check Margins: No Cutaneous horn 0.6cm EDC  Inflamed seborrheic keratosis (9) R chest parasternal x 2, back/shoulders x 4, L arm x 2, R arm x 1 Symptomatic, irritating, patient would like treated. Destruction of lesion - R chest parasternal x 2, back/shoulders x 4, L arm x 2, R arm x 1 Complexity: simple   Destruction method: cryotherapy   Informed consent: discussed and consent obtained   Timeout:  patient name, date of birth, surgical site, and procedure verified Lesion destroyed using liquid nitrogen: Yes   Region frozen until ice ball extended beyond lesion: Yes   Outcome: patient tolerated procedure well with no complications   Post-procedure details: wound  care instructions given    Actinic Damage - chronic, secondary to cumulative UV radiation exposure/sun exposure over time - diffuse scaly erythematous macules with underlying dyspigmentation - Recommend daily broad spectrum sunscreen SPF 30+ to sun-exposed areas, reapply every 2 hours as needed.  - Recommend staying in the shade  or wearing long sleeves, sun glasses (UVA+UVB protection) and wide brim hats (4-inch brim around the entire circumference of the hat). - Call for new or changing lesions.   Seborrheic Keratoses - Stuck-on, waxy, tan-brown papules and/or plaques  - Benign-appearing - Discussed benign etiology and prognosis. - Observe - Call for any changes  Lentigines - Scattered tan macules - Due to sun exposure - Benign-appearing, observe - Recommend daily broad spectrum sunscreen SPF 30+ to sun-exposed areas, reapply every 2 hours as needed. - Call for any changes   Sebaceous Hyperplasia - Small yellow papules with a central dell - Benign - Observe   Hemangiomas - Red papules - Discussed benign nature - Observe - Call for any changes   Return in about 1 year (around 03/07/2023) for TBSE, Hx of AKs.  I, Othelia Pulling, RMA, am acting as scribe for Sarina Ser, MD . Documentation: I have reviewed the above documentation for accuracy and completeness, and I agree with the above.  Sarina Ser, MD

## 2022-03-06 NOTE — Patient Instructions (Addendum)
Wound Care Instructions  Cleanse wound gently with soap and water once a day then pat dry with clean gauze. Apply a thin coat of Petrolatum (petroleum jelly, "Vaseline") over the wound (unless you have an allergy to this). We recommend that you use a new, sterile tube of Vaseline. Do not pick or remove scabs. Do not remove the yellow or white "healing tissue" from the base of the wound.  Cover the wound with fresh, clean, nonstick gauze and secure with paper tape. You may use Band-Aids in place of gauze and tape if the wound is small enough, but would recommend trimming much of the tape off as there is often too much. Sometimes Band-Aids can irritate the skin.  You should call the office for your biopsy report after 1 week if you have not already been contacted.  If you experience any problems, such as abnormal amounts of bleeding, swelling, significant bruising, significant pain, or evidence of infection, please call the office immediately.  FOR ADULT SURGERY PATIENTS: If you need something for pain relief you may take 1 extra strength Tylenol (acetaminophen) AND 2 Ibuprofen (200mg each) together every 4 hours as needed for pain. (do not take these if you are allergic to them or if you have a reason you should not take them.) Typically, you may only need pain medication for 1 to 3 days.     Due to recent changes in healthcare laws, you may see results of your pathology and/or laboratory studies on MyChart before the doctors have had a chance to review them. We understand that in some cases there may be results that are confusing or concerning to you. Please understand that not all results are received at the same time and often the doctors may need to interpret multiple results in order to provide you with the best plan of care or course of treatment. Therefore, we ask that you please give us 2 business days to thoroughly review all your results before contacting the office for clarification. Should  we see a critical lab result, you will be contacted sooner.   If You Need Anything After Your Visit  If you have any questions or concerns for your doctor, please call our main line at [phone] and press option 4 to reach your doctor's medical assistant. If no one answers, please leave a voicemail as directed and we will return your call as soon as possible. Messages left after 4 pm will be answered the following business day.   You may also send us a message via MyChart. We typically respond to MyChart messages within 1-2 business days.  For prescription refills, please ask your pharmacy to contact our office. Our fax number is [phone].  If you have an urgent issue when the clinic is closed that cannot wait until the next business day, you can page your doctor at the number below.    Please note that while we do our best to be available for urgent issues outside of office hours, we are not available 24/7.   If you have an urgent issue and are unable to reach us, you may choose to seek medical care at your doctor's office, retail clinic, urgent care center, or emergency room.  If you have a medical emergency, please immediately call 911 or go to the emergency department.  Pager Numbers  - Dr. Kowalski: [phone]  - Dr. Moye: [phone]  - Dr. Stewart: [phone]  In the event of inclement weather, please call our main line at   [phone] for an update on the status of any delays or closures.  Dermatology Medication Tips: Please keep the boxes that topical medications come in in order to help keep track of the instructions about where and how to use these. Pharmacies typically print the medication instructions only on the boxes and not directly on the medication tubes.   If your medication is too expensive, please contact our office at [phone] option 4 or send us a message through MyChart.   We are unable to tell what your co-pay for medications will be in  advance as this is different depending on your insurance coverage. However, we may be able to find a substitute medication at lower cost or fill out paperwork to get insurance to cover a needed medication.   If a prior authorization is required to get your medication covered by your insurance company, please allow us 1-2 business days to complete this process.  Drug prices often vary depending on where the prescription is filled and some pharmacies may offer cheaper prices.  The website www.goodrx.com contains coupons for medications through different pharmacies. The prices here do not account for what the cost may be with help from insurance (it may be cheaper with your insurance), but the website can give you the price if you did not use any insurance.  - You can print the associated coupon and take it with your prescription to the pharmacy.  - You may also stop by our office during regular business hours and pick up a GoodRx coupon card.  - If you need your prescription sent electronically to a different pharmacy, notify our office through [place] MyChart or by phone at [phone] option 4.     Si Usted Necesita Algo Despus de Su Visita  Tambin puede enviarnos un mensaje a travs de MyChart. Por lo general respondemos a los mensajes de MyChart en el transcurso de 1 a 2 das hbiles.  Para renovar recetas, por favor pida a su farmacia que se ponga en contacto con nuestra oficina. Nuestro nmero de fax es el [phone].  Si tiene un asunto urgente cuando la clnica est cerrada y que no puede esperar hasta el siguiente da hbil, puede llamar/localizar a su doctor(a) al nmero que aparece a continuacin.   Por favor, tenga en cuenta que aunque hacemos todo lo posible para estar disponibles para asuntos urgentes fuera del horario de oficina, no estamos disponibles las 24 horas del da, los 7 das de la semana.   Si tiene un problema urgente y no puede comunicarse con nosotros, puede  optar por buscar atencin mdica  en el consultorio de su doctor(a), en una clnica privada, en un centro de atencin urgente o en una sala de emergencias.  Si tiene una emergencia mdica, por favor llame inmediatamente al 911 o vaya a la sala de emergencias.  Nmeros de bper  - Dr. Kowalski: [phone]  - Dra. Moye: [phone]  - Dra. Stewart: [phone]  En caso de inclemencias del tiempo, por favor llame a nuestra lnea principal al [phone] para una actualizacin sobre el estado de cualquier retraso o cierre.  Consejos para la medicacin en dermatologa: Por favor, guarde las cajas en las que vienen los medicamentos de uso tpico para ayudarle a seguir las instrucciones sobre dnde y cmo usarlos. Las farmacias generalmente imprimen las instrucciones del medicamento slo en las cajas y no directamente en los tubos del medicamento.   Si su medicamento es muy caro, por favor, pngase en contacto con   nuestra oficina llamando al [phone] y presione la opcin 4 o envenos un mensaje a travs de MyChart.   No podemos decirle cul ser su copago por los medicamentos por adelantado ya que esto es diferente dependiendo de la cobertura de su seguro. Sin embargo, es posible que podamos encontrar un medicamento sustituto a menor costo o llenar un formulario para que el seguro cubra el medicamento que se considera necesario.   Si se requiere una autorizacin previa para que su compaa de seguros cubra su medicamento, por favor permtanos de 1 a 2 das hbiles para completar este proceso.  Los precios de los medicamentos varan con frecuencia dependiendo del lugar de dnde se surte la receta y alguna farmacias pueden ofrecer precios ms baratos.  El sitio web www.goodrx.com tiene cupones para medicamentos de diferentes farmacias. Los precios aqu no tienen en cuenta lo que podra costar con la ayuda del seguro (puede ser ms barato con su seguro), pero el sitio web puede darle el  precio si no utiliz ningn seguro.  - Puede imprimir el cupn correspondiente y llevarlo con su receta a la farmacia.  - Tambin puede pasar por nuestra oficina durante el horario de atencin regular y recoger una tarjeta de cupones de GoodRx.  - Si necesita que su receta se enve electrnicamente a una farmacia diferente, informe a nuestra oficina a travs de MyChart de [place] o por telfono llamando al [phone] y presione la opcin 4.  

## 2022-03-12 ENCOUNTER — Telehealth: Payer: Self-pay

## 2022-03-12 NOTE — Telephone Encounter (Signed)
Left pt msg to call for bx result/sh °

## 2022-03-12 NOTE — Telephone Encounter (Signed)
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 

## 2022-03-16 ENCOUNTER — Encounter: Payer: Self-pay | Admitting: Dermatology

## 2022-03-22 ENCOUNTER — Telehealth: Payer: Self-pay

## 2022-03-22 NOTE — Telephone Encounter (Signed)
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

## 2022-03-22 NOTE — Telephone Encounter (Signed)
Advised pt of bx results/sh ?

## 2022-09-13 ENCOUNTER — Ambulatory Visit: Payer: No Typology Code available for payment source | Admitting: Dermatology

## 2022-12-17 IMAGING — CR DG CHEST 2V
2 series · 2 of 2 positions shown · non-contrast
Comparison: None.

CLINICAL DATA: [id number] diagnosed 06/01/2020, increasing shortness
of breath, hypoxia

EXAM:
CHEST - 2 VIEW

[chest pa]
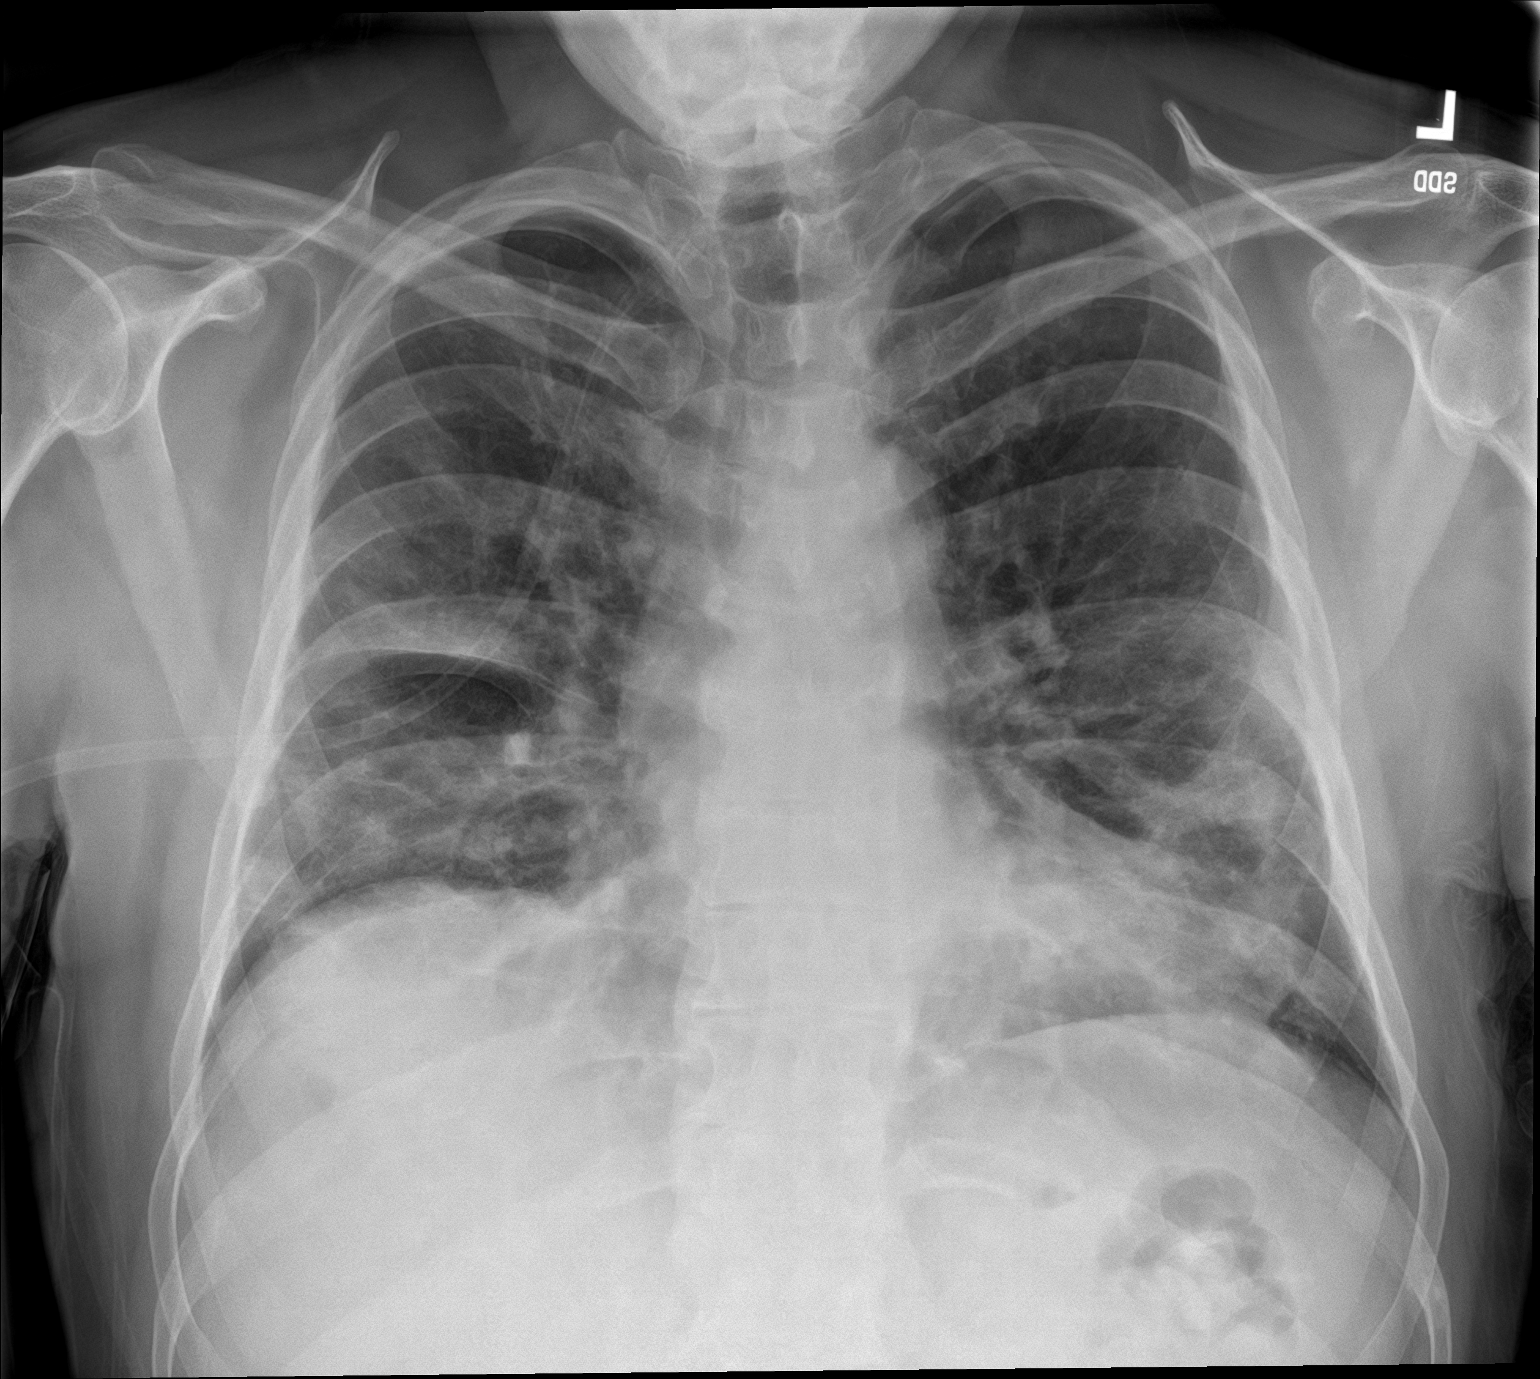

[chest lat]
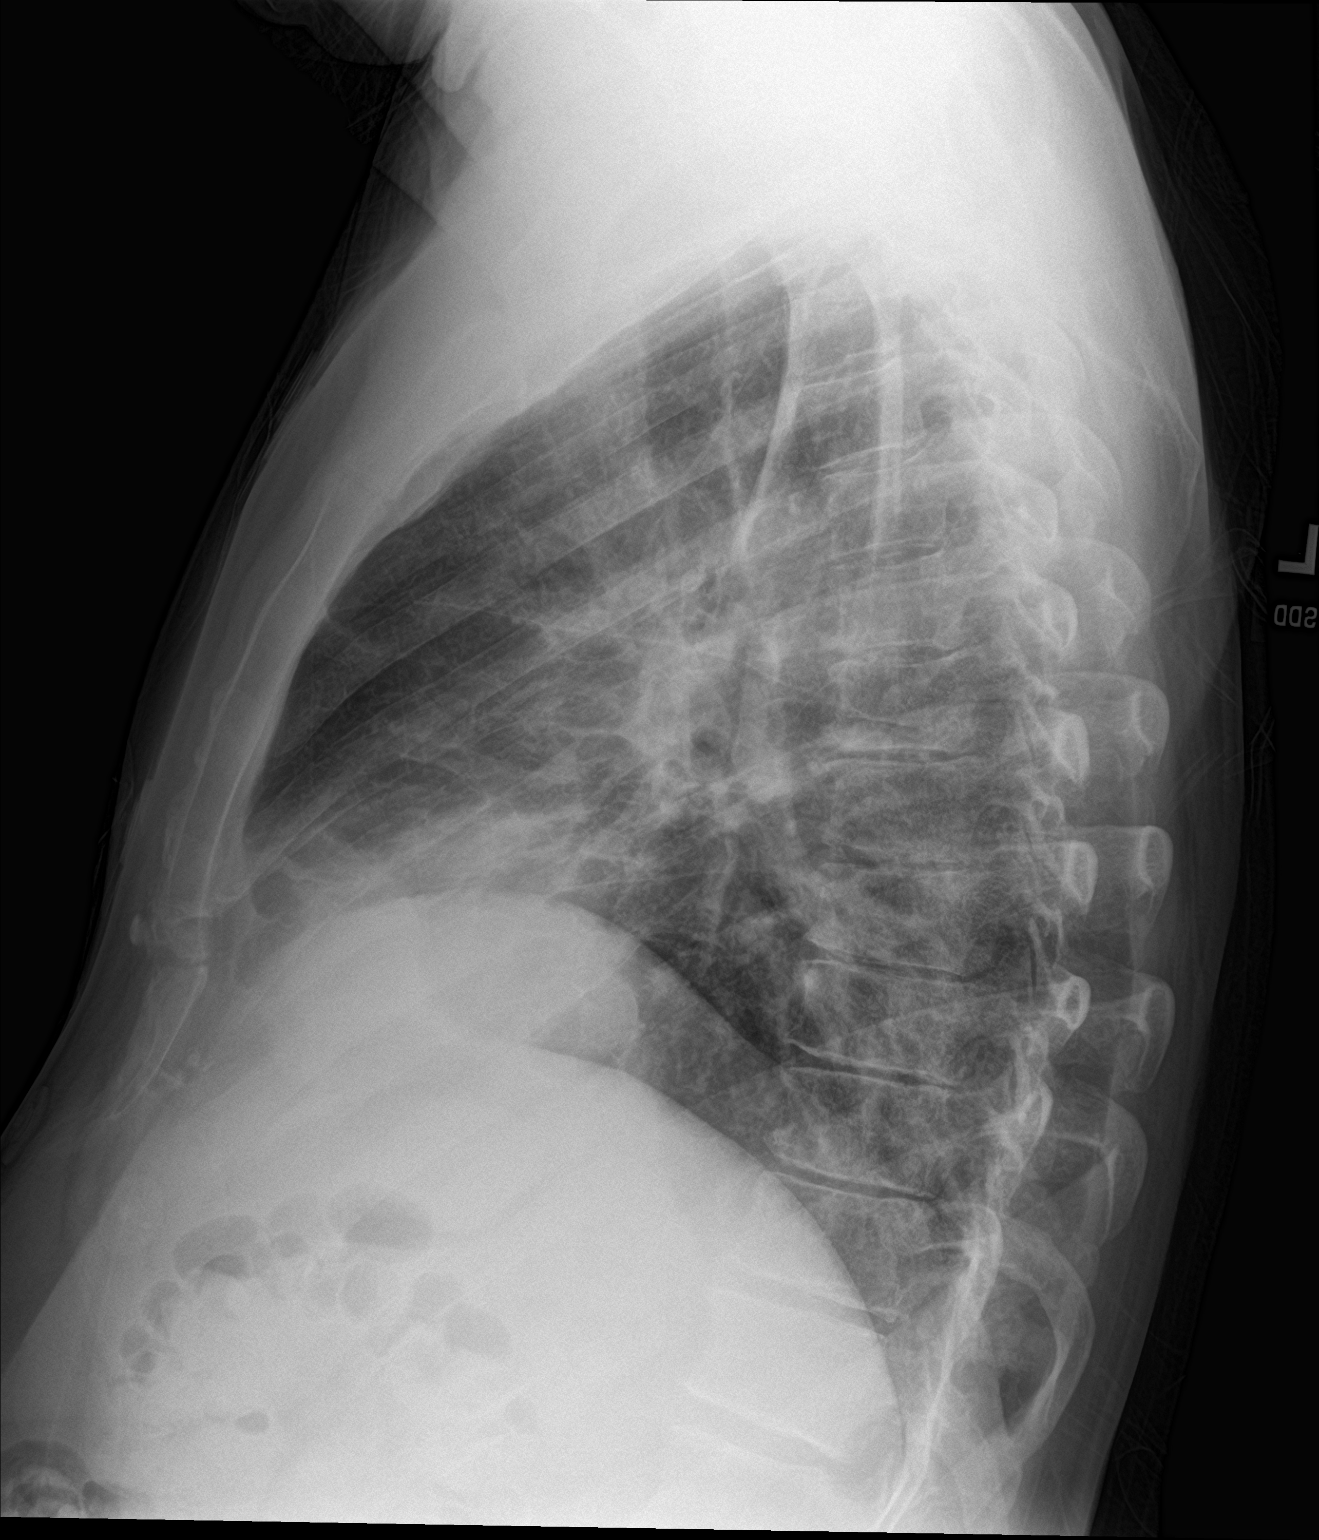

[2 of 2 positions shown; findings below may reference images not displayed]

FINDINGS: Frontal and lateral views of the chest demonstrate an unremarkable
cardiac silhouette. Multifocal bilateral airspace disease greatest
in the right upper and left lower lobes. No effusion or
pneumothorax. No acute bony abnormalities.
IMPRESSION: 1. Multifocal bilateral pneumonia compatible with COVID 19.

## 2023-03-07 ENCOUNTER — Ambulatory Visit: Payer: BC Managed Care – PPO | Admitting: Dermatology

## 2023-03-07 DIAGNOSIS — L578 Other skin changes due to chronic exposure to nonionizing radiation: Secondary | ICD-10-CM | POA: Diagnosis not present

## 2023-03-07 DIAGNOSIS — I8393 Asymptomatic varicose veins of bilateral lower extremities: Secondary | ICD-10-CM | POA: Diagnosis not present

## 2023-03-07 DIAGNOSIS — D2271 Melanocytic nevi of right lower limb, including hip: Secondary | ICD-10-CM

## 2023-03-07 DIAGNOSIS — W908XXA Exposure to other nonionizing radiation, initial encounter: Secondary | ICD-10-CM

## 2023-03-07 DIAGNOSIS — L814 Other melanin hyperpigmentation: Secondary | ICD-10-CM

## 2023-03-07 DIAGNOSIS — Z85828 Personal history of other malignant neoplasm of skin: Secondary | ICD-10-CM

## 2023-03-07 DIAGNOSIS — Z8589 Personal history of malignant neoplasm of other organs and systems: Secondary | ICD-10-CM

## 2023-03-07 DIAGNOSIS — Z872 Personal history of diseases of the skin and subcutaneous tissue: Secondary | ICD-10-CM

## 2023-03-07 DIAGNOSIS — D229 Melanocytic nevi, unspecified: Secondary | ICD-10-CM

## 2023-03-07 DIAGNOSIS — I781 Nevus, non-neoplastic: Secondary | ICD-10-CM | POA: Diagnosis not present

## 2023-03-07 DIAGNOSIS — B079 Viral wart, unspecified: Secondary | ICD-10-CM | POA: Diagnosis not present

## 2023-03-07 DIAGNOSIS — Z1283 Encounter for screening for malignant neoplasm of skin: Secondary | ICD-10-CM

## 2023-03-07 DIAGNOSIS — L820 Inflamed seborrheic keratosis: Secondary | ICD-10-CM | POA: Diagnosis not present

## 2023-03-07 DIAGNOSIS — L918 Other hypertrophic disorders of the skin: Secondary | ICD-10-CM

## 2023-03-07 DIAGNOSIS — L821 Other seborrheic keratosis: Secondary | ICD-10-CM

## 2023-03-07 NOTE — Patient Instructions (Addendum)
Seborrheic Keratosis  What causes seborrheic keratoses? Seborrheic keratoses are harmless, common skin growths that first appear during adult life.  As time goes by, more growths appear.  Some people may develop a large number of them.  Seborrheic keratoses appear on both covered and uncovered body parts.  They are not caused by sunlight.  The tendency to develop seborrheic keratoses can be inherited.  They vary in color from skin-colored to gray, brown, or even black.  They can be either smooth or have a rough, warty surface.   Seborrheic keratoses are superficial and look as if they were stuck on the skin.  Under the microscope this type of keratosis looks like layers upon layers of skin.  That is why at times the top layer may seem to fall off, but the rest of the growth remains and re-grows.    Treatment Seborrheic keratoses do not need to be treated, but can easily be removed in the office.  Seborrheic keratoses often cause symptoms when they rub on clothing or jewelry.  Lesions can be in the way of shaving.  If they become inflamed, they can cause itching, soreness, or burning.  Removal of a seborrheic keratosis can be accomplished by freezing, burning, or surgery. If any spot bleeds, scabs, or grows rapidly, please return to have it checked, as these can be an indication of a skin cancer.   Cryotherapy Aftercare  Wash gently with soap and water everyday.   Apply Vaseline and Band-Aid daily until healed.    Viral Wart (HPV) Counseling  Discussed viral / HPV (Human Papilloma Virus) etiology and risk of spread /infectivity to other areas of body as well as to other people.  Multiple treatments and methods may be required to clear warts and it is possible treatment may not be successful.  Treatment risks include discoloration; scarring and there is still potential for wart recurrence.    Melanoma ABCDEs      Melanoma is the most dangerous type of skin cancer, and is the leading cause  of death from skin disease.  You are more likely to develop melanoma if you: Have light-colored skin, light-colored eyes, or red or blond hair Spend a lot of time in the sun Tan regularly, either outdoors or in a tanning bed Have had blistering sunburns, especially during childhood Have a close family member who has had a melanoma Have atypical moles or large birthmarks  Early detection of melanoma is key since treatment is typically straightforward and cure rates are extremely high if we catch it early.   The first sign of melanoma is often a change in a mole or a new dark spot.  The ABCDE system is a way of remembering the signs of melanoma.  A for asymmetry:  The two halves do not match. B for border:  The edges of the growth are irregular. C for color:  A mixture of colors are present instead of an even brown color. D for diameter:  Melanomas are usually (but not always) greater than 6mm - the size of a pencil eraser. E for evolution:  The spot keeps changing in size, shape, and color.  Please check your skin once per month between visits. You can use a small mirror in front and a large mirror behind you to keep an eye on the back side or your body.   If you see any new or changing lesions before your next follow-up, please call to schedule a visit.  Please continue daily skin  protection including broad spectrum sunscreen SPF 30+ to sun-exposed areas, reapplying every 2 hours as needed when you're outdoors.   Staying in the shade or wearing long sleeves, sun glasses (UVA+UVB protection) and wide brim hats (4-inch brim around the entire circumference of the hat) are also recommended for sun protection.    Due to recent changes in healthcare laws, you may see results of your pathology and/or laboratory studies on MyChart before the doctors have had a chance to review them. We understand that in some cases there may be results that are confusing or concerning to you. Please understand that  not all results are received at the same time and often the doctors may need to interpret multiple results in order to provide you with the best plan of care or course of treatment. Therefore, we ask that you please give [place] 2 business days to thoroughly review all your results before contacting the office for clarification. Should we see a critical lab result, you will be contacted sooner.   If You Need Anything After Your Visit  If you have any questions or concerns for your doctor, please call our main line at [phone] and press option 4 to reach your doctor's medical assistant. If no one answers, please leave a voicemail as directed and we will return your call as soon as possible. Messages left after 4 pm will be answered the following business day.   You may also send [place] a message via MyChart. We typically respond to MyChart messages within 1-2 business days.  For prescription refills, please ask your pharmacy to contact our office. Our fax number is [phone].  If you have an urgent issue when the clinic is closed that cannot wait until the next business day, you can page your doctor at the number below.    Please note that while we do our best to be available for urgent issues outside of office hours, we are not available 24/7.   If you have an urgent issue and are unable to reach [place], you may choose to seek medical care at your doctor's office, retail clinic, urgent care center, or emergency room.  If you have a medical emergency, please immediately call 911 or go to the emergency department.  Pager Numbers  - Dr. Gwen Pounds: [phone]  - Dr. Roseanne Reno: [phone]  - Dr. Katrinka Blazing: [phone]   In the event of inclement weather, please call our main line at [phone] for an update on the status of any delays or closures.  Dermatology Medication Tips: Please keep the boxes that topical medications come in in order to help keep track of the instructions about where and how  to use these. Pharmacies typically print the medication instructions only on the boxes and not directly on the medication tubes.   If your medication is too expensive, please contact our office at [phone] option 4 or send [place] a message through MyChart.   We are unable to tell what your co-pay for medications will be in advance as this is different depending on your insurance coverage. However, we may be able to find a substitute medication at lower cost or fill out paperwork to get insurance to cover a needed medication.   If a prior authorization is required to get your medication covered by your insurance company, please allow [place] 1-2 business days to complete this process.  Drug prices often vary depending on where the prescription is filled and some pharmacies may offer cheaper prices.  The website www.goodrx.com  contains coupons for medications through different pharmacies. The prices here do not account for what the cost may be with help from insurance (it may be cheaper with your insurance), but the website can give you the price if you did not use any insurance.  - You can print the associated coupon and take it with your prescription to the pharmacy.  - You may also stop by our office during regular business hours and pick up a GoodRx coupon card.  - If you need your prescription sent electronically to a different pharmacy, notify our office through [facility] or by phone at [phone] option 4.     Si Usted Necesita Algo Despus de Su Visita  Tambin puede enviarnos un mensaje a travs de Clinical cytogeneticist. Por lo general respondemos a los mensajes de MyChart en el transcurso de 1 a 2 das hbiles.  Para renovar recetas, por favor pida a su farmacia que se ponga en contacto con nuestra oficina. Annie Sable de fax es [place] [phone].  Si tiene un asunto urgente cuando la clnica est cerrada y que no puede esperar hasta el siguiente da hbil, puede llamar/localizar a su  doctor(a) al nmero que aparece a continuacin.   Por favor, tenga en cuenta que aunque hacemos todo lo posible para estar disponibles para asuntos urgentes fuera del horario de [place], no estamos disponibles las 24 horas del da, los 7 [address]  [address] de la [place].   Si tiene un problema urgente y no puede comunicarse con nosotros, puede optar por buscar atencin mdica  en el consultorio de su doctor(a), en una clnica privada, en un centro de atencin urgente o en una sala de emergencias.  Si tiene Engineer, drilling, por favor llame inmediatamente al 911 o vaya a la sala de emergencias.  Nmeros de bper  - Dr. Gwen Pounds: [phone]  - Dra. Roseanne Reno: [phone]  - Dr. Katrinka Blazing: [phone]   En caso de inclemencias del tiempo, por favor llame a Lacy Duverney principal al [phone] para una actualizacin sobre el [place] de cualquier retraso o cierre.  Consejos para la medicacin en dermatologa: Por favor, guarde las cajas en las que vienen los medicamentos de uso tpico para ayudarle a seguir las instrucciones sobre dnde y cmo usarlos. Las farmacias generalmente imprimen las instrucciones del medicamento slo en las cajas y no directamente en los tubos del [place].   Si su medicamento es muy caro, por favor, pngase en contacto con Rolm Gala llamando al [phone] y presione la opcin 4 o envenos un mensaje a travs de Clinical cytogeneticist.   No podemos decirle cul ser su copago por los medicamentos por adelantado ya que esto es diferente dependiendo de la cobertura de su seguro. Sin embargo, es posible que podamos encontrar un medicamento sustituto a Audiological scientist un formulario para que el seguro cubra el medicamento que se considera necesario.   Si se requiere una autorizacin previa para que su compaa de seguros [place] su medicamento, por favor permtanos de 1 a 2 das hbiles para completar [address]  Los precios de los medicamentos varan con frecuencia dependiendo del  Environmental consultant de dnde se surte la receta y alguna farmacias pueden ofrecer precios ms baratos.  El sitio web www.goodrx.com tiene cupones para medicamentos de Health and safety inspector. Los precios aqu no tienen en cuenta lo que podra costar con la ayuda del seguro (puede ser ms barato con su seguro), pero el sitio web puede darle el precio si no utiliz Tourist information centre manager.  - Puede imprimir  el cupn correspondiente y llevarlo con su receta a la farmacia.  - Tambin puede pasar por nuestra oficina durante el horario de atencin regular y Education officer, museum una tarjeta de cupones de GoodRx.  - Si necesita que su receta se enve electrnicamente a una farmacia diferente, informe a nuestra oficina a travs de MyChart de [place] o por telfono llamando al [phone] y presione la opcin 4.

## 2023-03-07 NOTE — Progress Notes (Signed)
Follow-Up Visit   Subjective  Christian Vazquez is a 57 y.o. male who presents for the following: Skin Cancer Screening and Full Body Skin Exam hx of isks, hx of scc Spot at left hand he noticed and spots under arms he would like removed.     The patient presents for Total-Body Skin Exam (TBSE) for skin cancer screening and mole check. The patient has spots, moles and lesions to be evaluated, some may be new or changing and the patient may have concern these could be cancer.   The following portions of the chart were reviewed this encounter and updated as appropriate: medications, allergies, medical history  Review of Systems:  No other skin or systemic complaints except as noted in HPI or Assessment and Plan.  Objective  Well appearing patient in no apparent distress; mood and affect are within normal limits.  A full examination was performed including scalp, head, eyes, ears, nose, lips, neck, chest, axillae, abdomen, back, buttocks, bilateral upper extremities, bilateral lower extremities, hands, feet, fingers, toes, fingernails, and toenails. All findings within normal limits unless otherwise noted below.   Relevant physical exam findings are noted in the Assessment and Plan.          left palm of hand x 1 Verrucous papules -- Discussed viral etiology and contagion.   left clavicle x 5, left arm x 1 (6) Erythematous stuck-on, waxy papule or plaque  left axilla x 5, right axilla x 5 (10) Fleshy, skin-colored pedunculated papules.    Left Axilla x 2 (2) Flesh, skin colored pedunculated papules x 2    Assessment & Plan   SKIN CANCER SCREENING PERFORMED TODAY.   Varicose Veins/Spider Veins - Dilated blue, purple or red veins at the lower extremities - Reassured - Smaller vessels can be treated by sclerotherapy (a procedure to inject a medicine into the veins to make them disappear) if desired, but the treatment is not covered by insurance. Larger vessels may be  covered if symptomatic and we would refer to vascular surgeon if treatment desired.  ACTINIC DAMAGE - Chronic condition, secondary to cumulative UV/sun exposure - diffuse scaly erythematous macules with underlying dyspigmentation - Recommend daily broad spectrum sunscreen SPF 30+ to sun-exposed areas, reapply every 2 hours as needed.  - Staying in the shade or wearing long sleeves, sun glasses (UVA+UVB protection) and wide brim hats (4-inch brim around the entire circumference of the hat) are also recommended for sun protection.  - Call for new or changing lesions.  LENTIGINES, SEBORRHEIC KERATOSES, HEMANGIOMAS - Benign normal skin lesions - Benign-appearing - Call for any changes  MELANOCYTIC NEVI - Tan-brown and/or pink-flesh-colored symmetric macules and papules - Benign appearing on exam today - Observation - Call clinic for new or changing moles - Recommend daily use of broad spectrum spf 30+ sunscreen to sun-exposed areas.   Nevus  Right medial knee  Exam 0.3 cm brown macule  See photo Benign-appearing.  Observation.  Call clinic for new or changing lesions.  Recommend daily use of broad spectrum spf 30+ sunscreen to sun-exposed areas.     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  - Call if any new or changing lesions are noted between office visits  Viral warts, unspecified type left palm of hand x 1  Viral Wart (HPV) Counseling  Discussed viral / HPV (Human Papilloma Virus) etiology and risk of spread /infectivity to other areas of body as well as to other people.  Multiple treatments and methods may be required to clear warts and it is possible treatment may not be successful.  Treatment risks include  discoloration; scarring and there is still potential for wart recurrence.  Destruction of lesion - left palm of hand x 1 Complexity: simple   Destruction method: cryotherapy   Informed consent: discussed and consent obtained   Timeout:  patient name, date of birth, surgical site, and procedure verified Lesion destroyed using liquid nitrogen: Yes   Region frozen until ice ball extended beyond lesion: Yes   Outcome: patient tolerated procedure well with no complications   Post-procedure details: wound care instructions given    Inflamed seborrheic keratosis (6) left clavicle x 5, left arm x 1  Symptomatic, irritating, patient would like treated.  Destruction of lesion - left clavicle x 5, left arm x 1 (6) Complexity: simple   Destruction method: cryotherapy   Informed consent: discussed and consent obtained   Timeout:  patient name, date of birth, surgical site, and procedure verified Lesion destroyed using liquid nitrogen: Yes   Region frozen until ice ball extended beyond lesion: Yes   Outcome: patient tolerated procedure well with no complications   Post-procedure details: wound care instructions given    Skin tag (12) Left Axilla x 2 (2); left axilla x 5, right axilla x 5 (10)  Symptomatic, irritating, patient would like treated.  Discussed Skin tag removal generally is considered cosmetic and not covered by insurance.  Cosmetic removal fee is $115 for up to 15 tags removed.     Destruction of lesion - left axilla x 5, right axilla x 5 (10) Complexity: simple   Destruction method: cryotherapy   Informed consent: discussed and consent obtained   Timeout:  patient name, date of birth, surgical site, and procedure verified Lesion destroyed using liquid nitrogen: Yes   Region frozen until ice ball extended beyond lesion: Yes   Outcome: patient tolerated procedure well with no complications   Post-procedure details: wound care instructions given    Epidermal / dermal  shaving - Left Axilla x 2 (2)  Informed consent: discussed and consent obtained   Anesthesia: the lesion was anesthetized in a standard fashion   Anesthetic:  1% lidocaine w/ epinephrine 1-100,000 buffered w/ 8.4% NaHCO3 Instrument used: scissors   Hemostasis achieved with: pressure, aluminum chloride and electrodesiccation   Outcome: patient tolerated procedure well   Post-procedure details: wound care instructions given     Return in about 1 year (around 03/06/2024) for TBSE.  IAsher Muir, CMA, am acting as scribe for Armida Sans, MD.   Documentation: I have reviewed the above documentation for accuracy and completeness, and I agree with the above.  Armida Sans, MD

## 2023-03-19 ENCOUNTER — Encounter: Payer: Self-pay | Admitting: Dermatology

## 2023-06-04 ENCOUNTER — Other Ambulatory Visit: Payer: Self-pay | Admitting: Surgery

## 2023-06-06 ENCOUNTER — Encounter
Admission: RE | Admit: 2023-06-06 | Discharge: 2023-06-06 | Disposition: A | Discharge status: Home / Self Care | Payer: BC Managed Care – PPO | Source: Ambulatory Visit | Attending: Surgery | Admitting: Surgery

## 2023-06-06 VITALS — Ht 70.0 in | Wt 214.9 lb

## 2023-06-06 DIAGNOSIS — Z01818 Encounter for other preprocedural examination: Secondary | ICD-10-CM

## 2023-06-06 DIAGNOSIS — Z01810 Encounter for preprocedural cardiovascular examination: Secondary | ICD-10-CM

## 2023-06-06 DIAGNOSIS — E119 Type 2 diabetes mellitus without complications: Secondary | ICD-10-CM

## 2023-06-06 DIAGNOSIS — Z01812 Encounter for preprocedural laboratory examination: Secondary | ICD-10-CM

## 2023-06-06 HISTORY — DX: Other chronic pain: G89.29

## 2023-06-06 HISTORY — DX: Hyperlipidemia, unspecified: E78.5

## 2023-06-06 HISTORY — DX: Type 2 diabetes mellitus without complications: E11.9

## 2023-06-06 HISTORY — DX: COVID-19: U07.1

## 2023-06-06 HISTORY — DX: Pneumonia due to coronavirus disease 2019: J12.82

## 2023-06-06 HISTORY — DX: Diverticulosis of intestine, part unspecified, without perforation or abscess without bleeding: K57.90

## 2023-06-06 HISTORY — DX: Unspecified osteoarthritis, unspecified site: M19.90

## 2023-06-06 NOTE — Patient Instructions (Signed)
Your procedure is scheduled on:06-13-23 Thursday Report to the Registration Desk on the 1st floor of the [facility].Then proceed to the 2nd floor Surgery Desk To find out your arrival time, please call [phone] between 1PM - 3PM on:06-12-23 Wednesday If your arrival time is 6:00 am, do not arrive before that time as the [facility] entrance doors do not open until 6:00 am.  REMEMBER: Instructions that are not followed completely may result in serious medical risk, up to and including death; or upon the discretion of your surgeon and anesthesiologist your surgery may need to be rescheduled.  Do not eat food after midnight the night before surgery.  No gum chewing or hard candies.  You may however, drink Water up to 2 hours before you are scheduled to arrive for your surgery. Do not drink anything within 2 hours of your scheduled arrival time.  In addition, your doctor has ordered for you to drink the provided:  Gatorade G2 Drinking this carbohydrate drink up to two hours before surgery helps to reduce insulin resistance and improve patient outcomes. Please complete drinking 2 hours before scheduled arrival time.  One week prior to surgery:Stop NOW (06-06-23) Stop Anti-inflammatories (NSAIDS) such as Advil, Aleve, Ibuprofen, Motrin, Naproxen, Naprosyn and Aspirin based products such as Excedrin, Goody's Powder, BC Powder. Stop ANY OVER THE COUNTER supplements until after surgery (Omega 3, Vitamin D3)  You may however, continue to take Tylenol if needed for pain up until the day of surgery.  Stop metFORMIN (GLUCOPHAGE-XR) 2 days prior to surgery-Last dose will be on 06-10-23 Monday  Stop sildenafil (REVATIO) 2 days prior to surgery-Last dose will be on 06-10-23 Monday  Continue taking all of your other prescription medications up until the day of surgery.  Do NOT take any medication the day of surgery  No Alcohol for 24 hours before or after surgery.  No Smoking including  e-cigarettes for 24 hours before surgery.  No chewable tobacco products for at least 6 hours before surgery.  No nicotine patches on the day of surgery.  Do not use any "recreational" drugs for at least a week (preferably 2 weeks) before your surgery.  Please be advised that the combination of cocaine and anesthesia may have negative outcomes, up to and including death. If you test positive for cocaine, your surgery will be cancelled.  On the morning of surgery brush your teeth with toothpaste and water, you may rinse your mouth with mouthwash if you wish. Do not swallow any toothpaste or mouthwash.  Use CHG Soap as directed on instruction sheet.  Do not wear jewelry, make-up, hairpins, clips or nail polish.  For welded (permanent) jewelry: bracelets, anklets, waist bands, etc.  Please have this removed prior to surgery.  If it is not removed, there is a chance that hospital personnel will need to cut it off on the day of surgery.  Do not wear lotions, powders, or perfumes.   Do not shave body hair from the neck down 48 hours before surgery.  Contact lenses, hearing aids and dentures may not be worn into surgery.  Do not bring valuables to the hospital. [facility] is not responsible for any missing/lost belongings or valuables.   Notify your doctor if there is any change in your medical condition (cold, fever, infection).  Wear comfortable clothing (specific to your surgery type) to the hospital.  After surgery, you can help prevent lung complications by doing breathing exercises.  Take deep breaths and cough every 1-2 hours.  Your doctor may order a device called an Incentive Spirometer to help you take deep breaths. When coughing or sneezing, hold a pillow firmly against your incision with both hands. This is called "splinting." Doing this helps protect your incision. It also decreases belly discomfort.  If you are being admitted to the hospital overnight, leave your suitcase in  the car. After surgery it may be brought to your room.  In case of increased patient census, it may be necessary for you, the patient, to continue your postoperative care in the Same Day Surgery department.  If you are being discharged the day of surgery, you will not be allowed to drive home. You will need a responsible individual to drive you home and stay with you for 24 hours after surgery.   If you are taking public transportation, you will need to have a responsible individual with you.  Please call the Pre-admissions Testing Dept. at [phone] if you have any questions about these instructions.  Surgery Visitation Policy:  Patients having surgery or a procedure may have two visitors.  Children under the age of 64 must have an adult with them who is not the patient.  Temporary Visitor Restrictions Due to increasing cases of flu, RSV and COVID-19: Children ages 16 and under will not be able to visit patients in [facility] hospitals under most circumstances.     Preparing for Surgery with CHLORHEXIDINE GLUCONATE (CHG) Soap  Chlorhexidine Gluconate (CHG) Soap  o An antiseptic cleaner that kills germs and bonds with the skin to continue killing germs even after washing  o Used for showering the night before surgery and morning of surgery  Before surgery, you can play an important role by reducing the number of germs on your skin.  CHG (Chlorhexidine gluconate) soap is an antiseptic cleanser which kills germs and bonds with the skin to continue killing germs even after washing.  Please do not use if you have an allergy to CHG or antibacterial soaps. If your skin becomes reddened/irritated stop using the CHG.  1. Shower the NIGHT BEFORE SURGERY and the MORNING OF SURGERY with CHG soap.  2. If you choose to wash your hair, wash your hair first as usual with your normal shampoo.  3. After shampooing, rinse your hair and body thoroughly to remove the shampoo.  4. Use CHG  as you would any other liquid soap. You can apply CHG directly to the skin and wash gently with a scrungie or a clean washcloth.  5. Apply the CHG soap to your body only from the neck down. Do not use on open wounds or open sores. Avoid contact with your eyes, ears, mouth, and genitals (private parts). Wash face and genitals (private parts) with your normal soap.  6. Wash thoroughly, paying special attention to the area where your surgery will be performed.  7. Thoroughly rinse your body with warm water.  8. Do not shower/wash with your normal soap after using and rinsing off the CHG soap.  9. Pat yourself dry with a clean towel.  10. Wear clean pajamas to bed the night before surgery.  12. Place clean sheets on your bed the night of your first shower and do not sleep with pets.  13. Shower again with the CHG soap on the day of surgery prior to arriving at the hospital.  14. Do not apply any deodorants/lotions/powders.  15. Please wear clean clothes to the hospital.   How to Use an Incentive Spirometer An incentive  spirometer is a tool that measures how well you are filling your lungs with each breath. Learning to take long, deep breaths using this tool can help you keep your lungs clear and active. This may help to reverse or lessen your chance of developing breathing (pulmonary) problems, especially infection. You may be asked to use a spirometer: After a surgery. If you have a lung problem or a history of smoking. After a long period of time when you have been unable to move or be active. If the spirometer includes an indicator to show the highest number that you have reached, your health care provider or respiratory therapist will help you set a goal. Keep a log of your progress as told by your health care provider. What are the risks? Breathing too quickly may cause dizziness or cause you to pass out. Take your time so you do not get dizzy or light-headed. If you are in pain, you  may need to take pain medicine before doing incentive spirometry. It is harder to take a deep breath if you are having pain. How to use your incentive spirometer  Sit up on the edge of your bed or on a chair. Hold the incentive spirometer so that it is in an upright position. Before you use the spirometer, breathe out normally. Place the mouthpiece in your mouth. Make sure your lips are closed tightly around it. Breathe in slowly and as deeply as you can through your mouth, causing the piston or the ball to rise toward the top of the chamber. Hold your breath for 3-5 seconds, or for as long as possible. If the spirometer includes a coach indicator, use this to guide you in breathing. Slow down your breathing if the indicator goes above the marked areas. Remove the mouthpiece from your mouth and breathe out normally. The piston or ball will return to the bottom of the chamber. Rest for a few seconds, then repeat the steps 10 or more times. Take your time and take a few normal breaths between deep breaths so that you do not get dizzy or light-headed. Do this every 1-2 hours when you are awake. If the spirometer includes a goal marker to show the highest number you have reached (best effort), use this as a goal to work toward during each repetition. After each set of 10 deep breaths, cough a few times. This will help to make sure that your lungs are clear. If you have an incision on your chest or abdomen from surgery, place a pillow or a rolled-up towel firmly against the incision when you cough. This can help to reduce pain while taking deep breaths and coughing. General tips When you are able to get out of bed: Walk around often. Continue to take deep breaths and cough in order to clear your lungs. Keep using the incentive spirometer until your health care provider says it is okay to stop using it. If you have been in the hospital, you may be told to keep using the spirometer at home. Contact a  health care provider if: You are having difficulty using the spirometer. You have trouble using the spirometer as often as instructed. Your pain medicine is not giving enough relief for you to use the spirometer as told. You have a fever. Get help right away if: You develop shortness of breath. You develop a cough with bloody mucus from the lungs. You have fluid or blood coming from an incision site after you cough. Summary An incentive  spirometer is a tool that can help you learn to take long, deep breaths to keep your lungs clear and active. You may be asked to use a spirometer after a surgery, if you have a lung problem or a history of smoking, or if you have been inactive for a long period of time. Use your incentive spirometer as instructed every 1-2 hours while you are awake. If you have an incision on your chest or abdomen, place a pillow or a rolled-up towel firmly against your incision when you cough. This will help to reduce pain. Get help right away if you have shortness of breath, you cough up bloody mucus, or blood comes from your incision when you cough. This information is not intended to replace advice given to you by your health care provider. Make sure you discuss any questions you have with your health care provider. Document Revised: 03/15/2023 Document Reviewed: 03/15/2023 Elsevier Patient Education  2024 [employer].

## 2023-06-07 ENCOUNTER — Encounter: Payer: Self-pay | Admitting: Urgent Care

## 2023-06-07 ENCOUNTER — Encounter
Admission: RE | Admit: 2023-06-07 | Discharge: 2023-06-07 | Disposition: A | Discharge status: Home / Self Care | Payer: BC Managed Care – PPO | Source: Ambulatory Visit | Attending: Surgery | Admitting: Surgery

## 2023-06-07 DIAGNOSIS — E119 Type 2 diabetes mellitus without complications: Secondary | ICD-10-CM | POA: Insufficient documentation

## 2023-06-07 DIAGNOSIS — Z01812 Encounter for preprocedural laboratory examination: Secondary | ICD-10-CM

## 2023-06-07 DIAGNOSIS — Z01810 Encounter for preprocedural cardiovascular examination: Secondary | ICD-10-CM

## 2023-06-07 DIAGNOSIS — Z01818 Encounter for other preprocedural examination: Secondary | ICD-10-CM | POA: Insufficient documentation

## 2023-06-07 LAB — CBC
HCT: 37.2 % — ABNORMAL LOW (ref 39.0–52.0)
Hemoglobin: 12.7 g/dL — ABNORMAL LOW (ref 13.0–17.0)
MCH: 30.7 pg (ref 26.0–34.0)
MCHC: 34.1 g/dL (ref 30.0–36.0)
MCV: 89.9 fL (ref 80.0–100.0)
Platelets: 177 10*3/uL (ref 150–400)
RBC: 4.14 MIL/uL — ABNORMAL LOW (ref 4.22–5.81)
RDW: 11.9 % (ref 11.5–15.5)
WBC: 6.2 10*3/uL (ref 4.0–10.5)
nRBC: 0 % (ref 0.0–0.2)

## 2023-06-07 LAB — BASIC METABOLIC PANEL
Anion gap: 10 (ref 5–15)
BUN: 19 mg/dL (ref 6–20)
CO2: 24 mmol/L (ref 22–32)
Calcium: 9.3 mg/dL (ref 8.9–10.3)
Chloride: 107 mmol/L (ref 98–111)
Creatinine, Ser: 0.76 mg/dL (ref 0.61–1.24)
GFR, Estimated: >60 mL/min (ref >60)
Glucose, Bld: 126 mg/dL — ABNORMAL HIGH (ref 70–99)
Potassium: 3.8 mmol/L (ref 3.5–5.1)
Sodium: 141 mmol/L (ref 135–145)

## 2023-06-12 NOTE — Anesthesia Preprocedure Evaluation (Signed)
Anesthesia Evaluation  Patient identified by MRN, date of birth, ID band Patient awake    Reviewed: Allergy & Precautions, H&P , NPO status , Patient's Chart, lab work & pertinent test results  Airway Mallampati: II  TM Distance: >3 FB Neck ROM: full    Dental no notable dental hx.    Pulmonary neg pulmonary ROS   Pulmonary exam normal        Cardiovascular negative cardio ROS Normal cardiovascular exam     Neuro/Psych negative neurological ROS  negative psych ROS   GI/Hepatic negative GI ROS, Neg liver ROS,,,  Endo/Other  diabetes, Well Controlled, Type 2    Renal/GU      Musculoskeletal  (+) Arthritis ,    Abdominal  (+) + obese  Peds  Hematology negative hematology ROS (+)   Anesthesia Other Findings Past Medical History: No date: Actinic keratosis No date: Chronic left shoulder pain No date: COVID-19 No date: Diverticulosis No date: DM (diabetes mellitus), type 2 (HCC) No date: Hyperlipidemia No date: OA (osteoarthritis) No date: Pneumonia due to COVID-19 virus 03/06/2022: Squamous cell carcinoma of skin     Comment:  Left Forearm - Posterior, EDC  Past Surgical History: No date: COLONOSCOPY 08/23/2020: COLONOSCOPY WITH PROPOFOL; N/A     Comment:  Procedure: COLONOSCOPY WITH PROPOFOL;  Surgeon:               Regis Bill, MD;  Location: [facility] ENDOSCOPY;                Service: Endoscopy;  Laterality: N/A;  covid positive               06/01/2020 No date: TONSILLECTOMY     Comment:  as a child No date: WISDOM TOOTH EXTRACTION     Reproductive/Obstetrics negative OB ROS                             Anesthesia Physical Anesthesia Plan  ASA: 2  Anesthesia Plan: General ETT   Post-op Pain Management: Regional block*   Induction:   PONV Risk Score and Plan: 2 and Ondansetron, Dexamethasone and Midazolam  Airway Management Planned: Oral ETT  Additional  Equipment:   Intra-op Plan:   Post-operative Plan: Extubation in OR  Informed Consent: I have reviewed the patients History and Physical, chart, labs and discussed the procedure including the risks, benefits and alternatives for the proposed anesthesia with the patient or authorized representative who has indicated his/her understanding and acceptance.     Dental Advisory Given  Plan Discussed with: CRNA and Surgeon  Anesthesia Plan Comments:         Anesthesia Quick Evaluation

## 2023-06-13 ENCOUNTER — Other Ambulatory Visit: Payer: Self-pay

## 2023-06-13 ENCOUNTER — Ambulatory Visit: Payer: BC Managed Care – PPO | Admitting: Anesthesiology

## 2023-06-13 ENCOUNTER — Ambulatory Visit: Payer: BC Managed Care – PPO

## 2023-06-13 ENCOUNTER — Ambulatory Visit: Payer: BC Managed Care – PPO | Admitting: Urgent Care

## 2023-06-13 ENCOUNTER — Encounter: Payer: Self-pay | Admitting: Surgery

## 2023-06-13 ENCOUNTER — Ambulatory Visit
Admission: RE | Admit: 2023-06-13 | Discharge: 2023-06-13 | Disposition: A | Discharge status: Home / Self Care | Payer: BC Managed Care – PPO | Attending: Surgery | Admitting: Surgery

## 2023-06-13 ENCOUNTER — Encounter
Admission: RE | Disposition: A | Discharge status: Home / Self Care | Payer: Self-pay | Source: Home / Self Care | Attending: Surgery

## 2023-06-13 DIAGNOSIS — Z7984 Long term (current) use of oral hypoglycemic drugs: Secondary | ICD-10-CM | POA: Insufficient documentation

## 2023-06-13 DIAGNOSIS — E119 Type 2 diabetes mellitus without complications: Secondary | ICD-10-CM | POA: Diagnosis not present

## 2023-06-13 DIAGNOSIS — M19012 Primary osteoarthritis, left shoulder: Secondary | ICD-10-CM | POA: Diagnosis present

## 2023-06-13 DIAGNOSIS — M1990 Unspecified osteoarthritis, unspecified site: Secondary | ICD-10-CM | POA: Diagnosis not present

## 2023-06-13 DIAGNOSIS — M7542 Impingement syndrome of left shoulder: Secondary | ICD-10-CM | POA: Diagnosis not present

## 2023-06-13 DIAGNOSIS — Z01818 Encounter for other preprocedural examination: Secondary | ICD-10-CM

## 2023-06-13 HISTORY — PX: SHOULDER ARTHROSCOPY WITH SUBACROMIAL DECOMPRESSION, ROTATOR CUFF REPAIR AND BICEP TENDON REPAIR: SHX5687

## 2023-06-13 LAB — GLUCOSE, CAPILLARY
Glucose-Capillary: 105 mg/dL — ABNORMAL HIGH (ref 70–99)
Glucose-Capillary: 122 mg/dL — ABNORMAL HIGH (ref 70–99)

## 2023-06-13 SURGERY — SHOULDER ARTHROSCOPY WITH SUBACROMIAL DECOMPRESSION, ROTATOR CUFF REPAIR AND BICEP TENDON REPAIR
Anesthesia: General | Site: Shoulder | Laterality: Left

## 2023-06-13 MED ORDER — OXYCODONE HCL 5 MG PO TABS: 5.0000 mg | ORAL_TABLET | Freq: Once | ORAL | Status: DC | PRN

## 2023-06-13 MED ORDER — BUPIVACAINE HCL (PF) 0.5 % IJ SOLN
INTRAMUSCULAR | Status: AC
  Filled 2023-06-13: qty 10

## 2023-06-13 MED ORDER — ACETAMINOPHEN 10 MG/ML IV SOLN
INTRAVENOUS | Status: AC
  Filled 2023-06-13: qty 100

## 2023-06-13 MED ORDER — ACETAMINOPHEN 10 MG/ML IV SOLN: 1000.0000 mg | Freq: Once | INTRAVENOUS | Status: DC | PRN

## 2023-06-13 MED ORDER — MIDAZOLAM HCL 2 MG/2ML IJ SOLN
INTRAMUSCULAR | Status: DC | PRN
  Administered 2023-06-13: 2 mg via INTRAVENOUS

## 2023-06-13 MED ORDER — OXYCODONE HCL 5 MG PO TABS: 5.0000 mg | ORAL_TABLET | ORAL | Status: DC | PRN

## 2023-06-13 MED ORDER — PROPOFOL 10 MG/ML IV BOLUS
INTRAVENOUS | Status: AC
  Filled 2023-06-13: qty 20

## 2023-06-13 MED ORDER — ONDANSETRON HCL 4 MG/2ML IJ SOLN: 4.0000 mg | Freq: Four times a day (QID) | INTRAMUSCULAR | Status: DC | PRN

## 2023-06-13 MED ORDER — OXYCODONE HCL 5 MG/5ML PO SOLN: 5.0000 mg | Freq: Once | ORAL | Status: DC | PRN

## 2023-06-13 MED ORDER — KETOROLAC TROMETHAMINE 30 MG/ML IJ SOLN
INTRAMUSCULAR | Status: AC
Start: 2023-06-13 — End: ?
  Filled 2023-06-13: qty 1

## 2023-06-13 MED ORDER — CEFAZOLIN SODIUM-DEXTROSE 2-4 GM/100ML-% IV SOLN
INTRAVENOUS | Status: AC
  Filled 2023-06-13: qty 100

## 2023-06-13 MED ORDER — DEXAMETHASONE SODIUM PHOSPHATE 10 MG/ML IJ SOLN
INTRAMUSCULAR | Status: DC | PRN
  Administered 2023-06-13: 10 mg via INTRAVENOUS

## 2023-06-13 MED ORDER — EPINEPHRINE PF 1 MG/ML IJ SOLN
INTRAMUSCULAR | Status: AC
  Filled 2023-06-13: qty 1

## 2023-06-13 MED ORDER — OXYCODONE HCL 5 MG PO TABS: 5.0000 mg | ORAL_TABLET | ORAL | 0 refills | Status: AC | PRN

## 2023-06-13 MED ORDER — BUPIVACAINE-EPINEPHRINE 0.5% -1:200000 IJ SOLN
INTRAMUSCULAR | Status: DC | PRN
  Administered 2023-06-13: 30 mL

## 2023-06-13 MED ORDER — BUPIVACAINE-EPINEPHRINE (PF) 0.5% -1:200000 IJ SOLN
INTRAMUSCULAR | Status: AC
  Filled 2023-06-13: qty 30

## 2023-06-13 MED ORDER — CEFAZOLIN SODIUM-DEXTROSE 2-4 GM/100ML-% IV SOLN
2.0000 g | INTRAVENOUS | Status: AC
  Administered 2023-06-13: 2 g via INTRAVENOUS

## 2023-06-13 MED ORDER — KETOROLAC TROMETHAMINE 30 MG/ML IJ SOLN
30.0000 mg | Freq: Once | INTRAMUSCULAR | Status: AC
  Administered 2023-06-13: 30 mg via INTRAVENOUS

## 2023-06-13 MED ORDER — LACTATED RINGERS IV SOLN
INTRAVENOUS | Status: DC | PRN
  Administered 2023-06-13: 2000 mL
  Administered 2023-06-13: 3000 mL

## 2023-06-13 MED ORDER — FENTANYL CITRATE (PF) 100 MCG/2ML IJ SOLN
INTRAMUSCULAR | Status: DC | PRN
  Administered 2023-06-13 (×2): 50 ug via INTRAVENOUS

## 2023-06-13 MED ORDER — PHENYLEPHRINE HCL (PRESSORS) 10 MG/ML IV SOLN
INTRAVENOUS | Status: AC
  Filled 2023-06-13: qty 1

## 2023-06-13 MED ORDER — FENTANYL CITRATE PF 50 MCG/ML IJ SOSY
PREFILLED_SYRINGE | INTRAMUSCULAR | Status: AC
  Filled 2023-06-13: qty 1

## 2023-06-13 MED ORDER — BUPIVACAINE LIPOSOME 1.3 % IJ SUSP
INTRAMUSCULAR | Status: AC
  Filled 2023-06-13: qty 20

## 2023-06-13 MED ORDER — DROPERIDOL 2.5 MG/ML IJ SOLN
0.6250 mg | Freq: Once | INTRAMUSCULAR | Status: DC | PRN
Start: 2023-06-13 — End: 2023-06-13

## 2023-06-13 MED ORDER — CHLORHEXIDINE GLUCONATE 0.12 % MT SOLN
15.0000 mL | Freq: Once | OROMUCOSAL | Status: AC
  Administered 2023-06-13: 15 mL via OROMUCOSAL

## 2023-06-13 MED ORDER — METOCLOPRAMIDE HCL 10 MG PO TABS: 5.0000 mg | ORAL_TABLET | Freq: Three times a day (TID) | ORAL | Status: DC | PRN

## 2023-06-13 MED ORDER — ROCURONIUM BROMIDE 100 MG/10ML IV SOLN
INTRAVENOUS | Status: DC | PRN
  Administered 2023-06-13: 20 mg via INTRAVENOUS
  Administered 2023-06-13: 50 mg via INTRAVENOUS

## 2023-06-13 MED ORDER — BUPIVACAINE HCL (PF) 0.5 % IJ SOLN
INTRAMUSCULAR | Status: DC | PRN
  Administered 2023-06-13: 10 mL via PERINEURAL

## 2023-06-13 MED ORDER — FENTANYL CITRATE (PF) 100 MCG/2ML IJ SOLN
INTRAMUSCULAR | Status: AC
  Filled 2023-06-13: qty 2

## 2023-06-13 MED ORDER — ONDANSETRON HCL 4 MG/2ML IJ SOLN
INTRAMUSCULAR | Status: AC
  Filled 2023-06-13: qty 2

## 2023-06-13 MED ORDER — DEXAMETHASONE SODIUM PHOSPHATE 10 MG/ML IJ SOLN
INTRAMUSCULAR | Status: AC
  Filled 2023-06-13: qty 1

## 2023-06-13 MED ORDER — ROCURONIUM BROMIDE 10 MG/ML (PF) SYRINGE
PREFILLED_SYRINGE | INTRAVENOUS | Status: AC
  Filled 2023-06-13: qty 10

## 2023-06-13 MED ORDER — PHENYLEPHRINE HCL-NACL 20-0.9 MG/250ML-% IV SOLN
INTRAVENOUS | Status: DC | PRN
  Administered 2023-06-13: 20 ug/min via INTRAVENOUS

## 2023-06-13 MED ORDER — METOCLOPRAMIDE HCL 5 MG/ML IJ SOLN: 5.0000 mg | Freq: Three times a day (TID) | INTRAMUSCULAR | Status: DC | PRN

## 2023-06-13 MED ORDER — CHLORHEXIDINE GLUCONATE 0.12 % MT SOLN
OROMUCOSAL | Status: AC
Start: 2023-06-13 — End: ?
  Filled 2023-06-13: qty 15

## 2023-06-13 MED ORDER — ACETAMINOPHEN 10 MG/ML IV SOLN
INTRAVENOUS | Status: DC | PRN
  Administered 2023-06-13: 1000 mg via INTRAVENOUS

## 2023-06-13 MED ORDER — ONDANSETRON HCL 4 MG PO TABS: 4.0000 mg | ORAL_TABLET | Freq: Four times a day (QID) | ORAL | Status: DC | PRN

## 2023-06-13 MED ORDER — BUPIVACAINE LIPOSOME 1.3 % IJ SUSP
INTRAMUSCULAR | Status: DC | PRN
  Administered 2023-06-13: 10 mL via PERINEURAL

## 2023-06-13 MED ORDER — SODIUM CHLORIDE 0.9 % IV SOLN: INTRAVENOUS | Status: DC

## 2023-06-13 MED ORDER — PROPOFOL 10 MG/ML IV BOLUS
INTRAVENOUS | Status: DC | PRN
  Administered 2023-06-13: 200 mg via INTRAVENOUS

## 2023-06-13 MED ORDER — ORAL CARE MOUTH RINSE: 15.0000 mL | Freq: Once | OROMUCOSAL | Status: AC

## 2023-06-13 MED ORDER — LIDOCAINE HCL (CARDIAC) PF 100 MG/5ML IV SOSY
PREFILLED_SYRINGE | INTRAVENOUS | Status: DC | PRN
  Administered 2023-06-13: 40 mg via INTRAVENOUS

## 2023-06-13 MED ORDER — ACETAMINOPHEN 325 MG PO TABS: 325.0000 mg | ORAL_TABLET | Freq: Four times a day (QID) | ORAL | Status: DC | PRN

## 2023-06-13 MED ORDER — FENTANYL CITRATE (PF) 100 MCG/2ML IJ SOLN: 25.0000 ug | INTRAMUSCULAR | Status: DC | PRN

## 2023-06-13 MED ORDER — MIDAZOLAM HCL 2 MG/2ML IJ SOLN
INTRAMUSCULAR | Status: AC
  Filled 2023-06-13: qty 2

## 2023-06-13 MED ORDER — SUGAMMADEX SODIUM 200 MG/2ML IV SOLN
INTRAVENOUS | Status: DC | PRN
  Administered 2023-06-13: 200 mg via INTRAVENOUS

## 2023-06-13 SURGICAL SUPPLY — 42 items
ANCHOR JUGGERKNOT WTAP NDL 2.9 (Anchor) IMPLANT
BIT DRILL JUGRKNT W/NDL BIT2.9 (DRILL) IMPLANT
BLADE FULL RADIUS 3.5 (BLADE) ×1 IMPLANT
BUR ACROMIONIZER 4.0 (BURR) ×1 IMPLANT
CHLORAPREP W/TINT 26 (MISCELLANEOUS) ×1 IMPLANT
COVER MAYO STAND STRL (DRAPES) ×1 IMPLANT
DILATOR 5.5 THREADED HEALICOIL (MISCELLANEOUS) IMPLANT
DRILL JUGGERKNOT W/NDL BIT 2.9 (DRILL) ×1
ELECT CAUTERY BLADE 6.4 (BLADE) ×1 IMPLANT
ELECT REM PT RETURN 9FT ADLT (ELECTROSURGICAL) ×1
ELECTRODE REM PT RTRN 9FT ADLT (ELECTROSURGICAL) ×1 IMPLANT
GAUZE SPONGE 4X4 12PLY STRL (GAUZE/BANDAGES/DRESSINGS) ×1 IMPLANT
GAUZE XEROFORM 1X8 LF (GAUZE/BANDAGES/DRESSINGS) ×1 IMPLANT
GLOVE BIO SURGEON STRL SZ7.5 (GLOVE) ×2 IMPLANT
GLOVE BIO SURGEON STRL SZ8 (GLOVE) ×2 IMPLANT
GLOVE BIOGEL PI IND STRL 8 (GLOVE) ×1 IMPLANT
GLOVE INDICATOR 8.0 STRL GRN (GLOVE) ×1 IMPLANT
GOWN STRL REUS W/ TWL LRG LVL3 (GOWN DISPOSABLE) ×1 IMPLANT
GOWN STRL REUS W/ TWL XL LVL3 (GOWN DISPOSABLE) ×1 IMPLANT
GRASPER SUT 15 45D LOW PRO (SUTURE) IMPLANT
IV LR IRRIG 3000ML ARTHROMATIC (IV SOLUTION) ×2 IMPLANT
KIT CANNULA 8X76-LX IN CANNULA (CANNULA) ×1 IMPLANT
MANIFOLD NEPTUNE II (INSTRUMENTS) ×1 IMPLANT
MASK FACE SPIDER DISP (MASK) ×1 IMPLANT
MAT ABSORB FLUID 56X50 GRAY (MISCELLANEOUS) ×1 IMPLANT
PACK ARTHROSCOPY SHOULDER (MISCELLANEOUS) ×1 IMPLANT
PAD ABD DERMACEA PRESS 5X9 (GAUZE/BANDAGES/DRESSINGS) ×2 IMPLANT
PASSER SUT FIRSTPASS SELF (INSTRUMENTS) IMPLANT
SLING ARM LRG DEEP (SOFTGOODS) ×1 IMPLANT
SLING ULTRA II LG (MISCELLANEOUS) ×1 IMPLANT
SPONGE T-LAP 18X18 ~~LOC~~+RFID (SPONGE) ×1 IMPLANT
STAPLER SKIN PROX 35W (STAPLE) ×1 IMPLANT
STRAP SAFETY 5IN WIDE (MISCELLANEOUS) ×1 IMPLANT
SUT ETHIBOND 0 MO6 C/R (SUTURE) ×1 IMPLANT
SUT ULTRABRAID 2 COBRAID 38 (SUTURE) IMPLANT
SUT VIC AB 2-0 CT1 TAPERPNT 27 (SUTURE) ×2 IMPLANT
TAPE MICROFOAM 4IN (TAPE) ×1 IMPLANT
TRAP FLUID SMOKE EVACUATOR (MISCELLANEOUS) ×1 IMPLANT
TUBE SET DOUBLEFLO INFLOW (TUBING) ×1 IMPLANT
TUBING CONNECTING 10 (TUBING) ×1 IMPLANT
WAND WEREWOLF FLOW 90D (MISCELLANEOUS) ×1 IMPLANT
WATER STERILE IRR 500ML POUR (IV SOLUTION) ×1 IMPLANT

## 2023-06-13 NOTE — Discharge Instructions (Addendum)
Orthopedic discharge instructions: Keep dressing dry and intact.  May shower after dressing changed on post-op day #4 (Monday).  Cover staples with Band-Aids after drying off. Apply ice frequently to shoulder. Take ibuprofen 600-800 mg TID with meals for 5-7 days, then as necessary. Take oxycodone as prescribed when needed.  May supplement with ES Tylenol if necessary. Keep shoulder immobilizer on at all times except may remove for bathing purposes. Follow-up in 10-14 days or as scheduled.

## 2023-06-13 NOTE — Anesthesia Procedure Notes (Signed)
Procedure Name: Intubation Date/Time: 06/13/2023 1:25 PM  Performed by: Lily Lovings, CRNAPre-anesthesia Checklist: Patient identified, Patient being monitored, Timeout performed, Emergency Drugs available and Suction available Patient Re-evaluated:Patient Re-evaluated prior to induction Oxygen Delivery Method: Circle system utilized Preoxygenation: Pre-oxygenation with 100% oxygen Induction Type: IV induction Ventilation: Mask ventilation without difficulty Laryngoscope Size: 3 and McGrath Grade View: Grade II Tube type: Oral Tube size: 7.0 mm Number of attempts: 1 Airway Equipment and Method: Stylet Placement Confirmation: ETT inserted through vocal cords under direct vision, positive ETCO2 and breath sounds checked- equal and bilateral Secured at: 22 cm Tube secured with: Tape Dental Injury: Teeth and Oropharynx as per pre-operative assessment  Comments: Soft bite block

## 2023-06-13 NOTE — H&P (Signed)
History of Present Illness: Christian Vazquez is a 58 y.o. male who presents today for his surgical history and physical for upcoming left shoulder arthroscopy with debridement, decompression and possible biceps tenodesis. Surgery scheduled with Dr. Joice Lofts on 06/13/2023. The patient denies any changes to his medical history since he was last evaluated. He denies any trauma or injury affecting left shoulder since last appointment. The patient does still have a moderate aching pain with attempted range of motion activities to the left shoulder. He does have some pain when sleeping on left side night. He is right-hand dominant. Pain score at today's visit is a 3 out of 10. He has not taken anything for discomfort at this time. He denies any personal history of heart attack or stroke. Denies any personal history of asthma or COPD. He is a type II diabetic, most recent A1c was 6.7. Denies any history of DVT. Of note the patient does have a history of metal around his eye and because of this has not been able to undergo an MRI scan.  Past Medical History: Hyperlipidemia  T2DM (type 2 diabetes mellitus) (CMS/HHS-HCC)   Past Surgical History: COLONOSCOPY 08/23/2020 (Diverticulosis/Otherwise normal colon/Repeat 62yrs/CTL)   Past Family History: Colon polyps Father   Medications: cholecalciferol (VITAMIN D3) 400 unit tablet Take 800 Units by mouth once daily  glimepiride (AMARYL) 2 MG tablet Take 1 tablet (2 mg total) by mouth daily with breakfast 90 tablet 3  lisinopriL (ZESTRIL) 5 MG tablet Take 1 tablet (5 mg total) by mouth once daily 90 tablet 3  metFORMIN (GLUCOPHAGE-XR) 750 MG XR tablet Take 1 tablet (750 mg total) by mouth once daily 90 tablet 3  OMEGA-3/DHA/EPA/FISH OIL (FISH OIL-OMEGA-3 FATTY ACIDS) 300-1,000 mg capsule Take 1 g by mouth.  sildenafil (REVATIO) 20 mg tablet TAKE 2-5 TABLETS BY MOUTH AS DIRECTED 90 tablet 2   Allergies: Vit E-Nonoxynol 9-Aloe Vera (Rash)   Review of Systems:  A  comprehensive 14 point ROS was performed, reviewed by me today, and the pertinent orthopaedic findings are documented in the HPI.  Physical Exam: BP (!) 146/78  Ht 177.8 cm (5\' 10" )  Wt 97.5 kg (215 lb)  BMI 30.85 kg/m  General/Constitutional: The patient appears to be well-nourished, well-developed, and in no acute distress. Neuro/Psych: Normal mood and affect, oriented to person, place and time. Eyes: Non-icteric. Pupils are equal, round, and reactive to light, and exhibit synchronous movement. ENT: Unremarkable. Lymphatic: No palpable adenopathy. Respiratory: Lungs clear to auscultation, Normal chest excursion, No wheezes, and Non-labored breathing Cardiovascular: Regular rate and rhythm. No murmurs. and No edema, swelling or tenderness, except as noted in detailed exam. Integumentary: No impressive skin lesions present, except as noted in detailed exam. Musculoskeletal: Unremarkable, except as noted in detailed exam.  General: Well developed, well nourished 58 y.o. male in no apparent distress. Normal affect. Normal communication. Patient answers questions appropriately. The patient has a normal gait. There is no antalgic component. There is no hip lurch.   Left Upper Extremity: Examination of the left shoulder and arm showed no bony abnormality or edema. With abduction the patient is able to reach close to 120 degrees, passively he can reach closer to 150 degrees. With forward flexion he is able to achieve close to 95 degrees and passively closer to 140 degrees. With the left arm abducted 9 degrees he can tolerate external rotation close to 80 degrees, internal rotation 65 degrees. He is able to internally rotate to his T11 vertebrae. The patient has moderate tenderness  with motion. The patient has a positive Hawkins test and a positive impingement test. The patient has a negative drop arm test. The patient is non-tender along the deltoid muscle. There is moderate subacromial space  tenderness with no AC joint tenderness. Tenderness to palpation along the anterior aspect of the shoulder at today's visit as well. Negative speeds test. The patient has no instability of the shoulder with anterior-posterior motion. There is a negative sulcus sign. The rotator cuff muscle strength is 5/5 with supraspinatus, 5/5 with internal rotation, and 5/5 with external rotation. There is no crepitus with range of motion activities.   Neurological: The patient has sensation that is intact to light touch and pinprick bilaterally. The patient has normal grip strength. The patient has full biceps, wrist extension, grip, and interosseous strength. The patient has 2 + DTRs bilaterally.  Vascular: The patient has less than 2 second capillary refill. The patient has normal ulnar and radial pulses. The patient has normal warmth to touch.   Imaging: AP, Y-scapular and axillary views of the left shoulder were obtained previously in the office and reviewed by me today. These x-rays do demonstrate underlying left shoulder osteoarthritic changes. The glenohumeral joint space appears to be relatively well-maintained however the patient does have a osteophyte off of the inferior aspect of the humeral head. There is some subchondral cystic formation to the humeral head identified. The subacromial space is well-maintained. No inferior osteophyte formation off of the AC joint. On axillary view the glenohumeral space appears to be well-maintained at this time. No evidence of dislocation. No acute fracture.  Impression: 1. Chronic left shoulder pain. 2. Impingement syndrome of left shoulder 3. Primary osteoarthritis of left shoulder 4. Tendinopathy of left biceps tendon 5. Tendinopathy of left rotator cuff 6. Type 2 diabetes mellitus without complication.  Plan:  1. Treatment options were discussed today with the patient. 2. While the patient would like to discuss potential surgical intervention he is adamant  that he would like to avoid any replacement type surgery at this time. 3. The patient is scheduled for a left shoulder arthroscopy with debridement, decompression and possible biceps tenodesis with Dr. Joice Lofts on 06/13/2023 4. We did discuss the possibility of left shoulder arthroscopy with debridement in addition to subacromial decompression and possible biceps tenodesis. We discussed that given the arthritic changes noted there is a chance that he does continue to experience limited motion and eventually would need to proceed with a replacement type procedure. The patient is aware of these risks but would still like to proceed with a left shoulder arthroscopy with debridement decompression to see if this helps with his pain. 5. This document will serve as a surgical history and physical for the patient. 6. The patient will follow-up per standard postop protocol. They can call the clinic they have any questions, new symptoms develop or symptoms worsen.  The procedure was discussed with the patient, as were the potential risks (including bleeding, infection, nerve and/or blood vessel injury, persistent or recurrent pain, failure of the repair, progression of arthritis, need for further surgery, blood clots, strokes, heart attacks and/or arhythmias, pneumonia, etc.) and benefits. The patient states his understanding and wishes to proceed.    H&P reviewed and patient re-examined. No changes.

## 2023-06-13 NOTE — Op Note (Signed)
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  Post-Op Diagnosis:   Impingement/tendinopathy with degenerative joint disease, degenerative labral fraying, and biceps tendinopathy, left shoulder.  Procedure:   Extensive arthroscopic debridement, arthroscopic subacromial decompression, and mini-open biceps tenodesis, left shoulder.  Anesthesia:   General endotracheal with interscalene block using Exparel placed preoperatively by the anesthesiologist.  Surgeon:   Maryagnes Amos, MD  Assistant:   Juanetta Snow, PA-S  Findings:   As above. There were extensive grade 3-4 chondromalacial changes involving the humeral head and glenoid. There was extensive degenerative labral fraying involving the anterior, superior, and posterior portions of the labrum without frank detachment from the glenoid rim. The biceps tendon demonstrated moderate "lip sticking" without partial full-thickness tears. The rotator cuff itself was in satisfactory condition.  Complications:   None  Fluids:   500 cc  Estimated blood loss:   10 cc  Tourniquet time:   None  Drains:   None  Closure:   Staples      Brief clinical note:   The patient is a 58 year old male with a long history of gradually worsening left shoulder pain. The patient's symptoms have progressed despite medications, activity modification, etc. The patient's history and examination are consistent with impingement/tendinopathy with moderate to severe underlying degenerative joint disease. These findings were confirmed by plain radiographs. Preoperative ultrasound imaging of his left shoulder also showed no evidence for rotator cuff pathology. The patient presents at this time for definitive management of these shoulder symptoms.  Procedure:   The patient underwent placement of an interscalene block by the anesthesiologist in the  preoperative holding area before being brought into the operating room and lain in the supine position. The patient then underwent general endotracheal intubation and anesthesia before being repositioned in the beach chair position using the beach chair positioner. The left shoulder and upper extremity were prepped with ChloraPrep solution before being draped sterilely. Preoperative antibiotics were administered. A timeout was performed to confirm the proper surgical site before the expected portal sites and incision site were injected with 0.5% Sensorcaine with epinephrine.   A posterior portal was created and the glenohumeral joint thoroughly inspected with the findings as described above. An anterior portal was created using an outside-in technique. The labrum and rotator cuff were further probed, again confirming the above-noted findings. Areas of labral fraying were debrided back to stable margins using the full-radius resector. The full-radius sector also was used to perform an extensive synovectomy as well as to perform abrasion chondroplasty of the loose articular cartilage involving the glenoid and humeral surfaces. The ArthroCare wand was inserted and used to release the biceps tendon from its labral anchor. It also was used to obtain hemostasis as well as to "anneal" the labrum superiorly and anteriorly. Finally, the ArthroCare wand was used to perform a debridement of the rotator interval as well as to release the anterior capsular tissues to better free off of the subscapularis tendon. The instruments were removed from the joint after suctioning the excess fluid.  The camera was repositioned through the posterior portal into the subacromial space. A separate lateral portal was created using an outside-in technique. The 3.5 mm full-radius resector was introduced and used to perform a subtotal bursectomy. The ArthroCare wand was then inserted and used to remove the periosteal tissue off the  undersurface of the anterior third of the acromion as well as to  recess the coracoacromial ligament from its attachment along the anterior and lateral margins of the acromion. The 4.0 mm acromionizing bur was introduced and used to complete the decompression by removing the undersurface of the anterior third of the acromion. The full radius resector was reintroduced to remove any residual bony debris before the ArthroCare wand was reintroduced to obtain hemostasis. The instruments were then removed from the subacromial space after suctioning the excess fluid.  An approximately 4-5 cm incision was made over the anterolateral aspect of the shoulder beginning at the anterolateral corner of the acromion and extending distally in line with the bicipital groove. This incision was carried down through the subcutaneous tissues to expose the deltoid fascia. The raphae between the anterior and middle thirds was identified and this plane developed to provide access into the subacromial space. Additional bursal tissues were debrided sharply using Metzenbaum scissors. The rotator cuff tear was readily identified and noted to be intact.   The bicipital groove was identified by palpation and opened for 1-1.5 cm. The biceps tendon stump was retrieved through this defect. The floor of the bicipital groove was roughened with a curet before a a single Biomet 2.9 mm JuggerKnot anchor was inserted. Both sets of sutures were passed through the biceps tendon and tied securely to effect the tenodesis. The bicipital sheath was reapproximated using two #0 Ethibond interrupted sutures, incorporating the biceps tendon to further reinforce the tenodesis.  The wound was copiously irrigated with sterile saline solution before the deltoid raphae was reapproximated using 2-0 Vicryl interrupted sutures. The subcutaneous tissues were closed in two layers using 2-0 Vicryl interrupted sutures before the skin was closed using staples. The portal  sites also were closed using staples. A sterile bulky dressing was applied to the shoulder before the arm was placed into a shoulder immobilizer. The patient was then awakened, extubated, and returned to the recovery room in satisfactory condition after tolerating the procedure well.

## 2023-06-13 NOTE — Transfer of Care (Signed)
Immediate Anesthesia Transfer of Care Note  Patient: Christian Vazquez  Procedure(s) Performed: SHOULDER ARTHROSCOPY WITH DEBRIDEMENT, DECOMPRESSION, AND BICEPS TENODESIS. (Left: Shoulder)  Patient Location: PACU  Anesthesia Type:General  Level of Consciousness: drowsy  Airway & Oxygen Therapy: Patient Spontanous Breathing and Patient connected to nasal cannula oxygen  Post-op Assessment: Report given to RN  Post vital signs: stable  Last Vitals:  Vitals Value Taken Time  BP 135/84 06/13/23 1509  Temp    Pulse 69 06/13/23 1510  Resp 23 06/13/23 1510  SpO2 100 % 06/13/23 1510  Vitals shown include unfiled device data.  Last Pain:  Vitals:   06/13/23 1131  TempSrc: Temporal  PainSc: 3          Complications: No notable events documented.

## 2023-06-14 ENCOUNTER — Encounter: Payer: Self-pay | Admitting: Surgery

## 2023-06-17 NOTE — Anesthesia Postprocedure Evaluation (Signed)
Anesthesia Post Note  Patient: Christian Vazquez  Procedure(s) Performed: SHOULDER ARTHROSCOPY WITH DEBRIDEMENT, DECOMPRESSION, AND BICEPS TENODESIS. (Left: Shoulder)  Patient location during evaluation: PACU Anesthesia Type: General Level of consciousness: awake and alert Pain management: pain level controlled Vital Signs Assessment: post-procedure vital signs reviewed and stable Respiratory status: spontaneous breathing, nonlabored ventilation and respiratory function stable Cardiovascular status: blood pressure returned to baseline and stable Postop Assessment: no apparent nausea or vomiting Anesthetic complications: no   No notable events documented.   Last Vitals:  Vitals:   06/13/23 1550 06/13/23 1625  BP:  133/83  Pulse: 76 78  Resp: 14 16  Temp: (!) 36.3 C 36.6 C  SpO2: 91%     Last Pain:  Vitals:   06/14/23 0914  TempSrc:   PainSc: 0-No pain                 Foye Deer

## 2023-11-12 ENCOUNTER — Other Ambulatory Visit: Payer: Self-pay | Admitting: Surgery

## 2023-11-20 ENCOUNTER — Other Ambulatory Visit: Payer: Self-pay

## 2023-11-20 ENCOUNTER — Encounter
Admission: RE | Admit: 2023-11-20 | Discharge: 2023-11-20 | Disposition: A | Discharge status: Home / Self Care | Source: Ambulatory Visit | Attending: Surgery | Admitting: Surgery

## 2023-11-20 VITALS — Wt 197.2 lb

## 2023-11-20 DIAGNOSIS — I10 Essential (primary) hypertension: Secondary | ICD-10-CM | POA: Diagnosis not present

## 2023-11-20 DIAGNOSIS — Z01812 Encounter for preprocedural laboratory examination: Secondary | ICD-10-CM | POA: Diagnosis present

## 2023-11-20 DIAGNOSIS — E119 Type 2 diabetes mellitus without complications: Secondary | ICD-10-CM | POA: Insufficient documentation

## 2023-11-20 HISTORY — DX: Unspecified disorder of synovium and tendon, right shoulder: M67.911

## 2023-11-20 LAB — BASIC METABOLIC PANEL WITH GFR
Anion gap: 11 (ref 5–15)
BUN: 21 mg/dL — ABNORMAL HIGH (ref 6–20)
CO2: 23 mmol/L (ref 22–32)
Calcium: 9.5 mg/dL (ref 8.9–10.3)
Chloride: 104 mmol/L (ref 98–111)
Creatinine, Ser: 0.64 mg/dL (ref 0.61–1.24)
GFR, Estimated: >60 mL/min (ref >60)
Glucose, Bld: 106 mg/dL — ABNORMAL HIGH (ref 70–99)
Potassium: 3.7 mmol/L (ref 3.5–5.1)
Sodium: 138 mmol/L (ref 135–145)

## 2023-11-20 NOTE — Patient Instructions (Signed)
 Your procedure is scheduled on:11-26-23 Tuesday Report to the Registration Desk on the 1st floor of the [facility].Then proceed to the 2nd floor Surgery Desk To find out your arrival time, please call [phone] between 1PM - 3PM on:11-25-23 Monday If your arrival time is 6:00 am, do not arrive before that time as the [facility] entrance doors do not open until 6:00 am.  REMEMBER: Instructions that are not followed completely may result in serious medical risk, up to and including death; or upon the discretion of your surgeon and anesthesiologist your surgery may need to be rescheduled.  Do not eat food after midnight the night before surgery.  No gum chewing or hard candies.  You may however, drink Water up to 2 hours before you are scheduled to arrive for your surgery. Do not drink anything within 2 hours of your scheduled arrival time.  In addition, your doctor has ordered for you to drink the provided: Gatorade G2 Drinking this carbohydrate drink up to two hours before surgery helps to reduce insulin  resistance and improve patient outcomes. Please complete drinking 2 hours before scheduled arrival time.  One week prior to surgery:Stop NOW (11-20-23) Stop Anti-inflammatories (NSAIDS) such as Advil, Aleve, Ibuprofen, Motrin, Naproxen, Naprosyn and Aspirin based products such as Excedrin, Goody's Powder, BC Powder. Stop ANY OVER THE COUNTER supplements until after surgery (Vitamin C, Vitamin D3, Vitamin B 12, Omega 3)  You may however, continue to take Tylenol  if needed for pain up until the day of surgery.  Stop metFORMIN (GLUCOPHAGE-XR) 2 days prior to surgery-Last dose will be on 11-23-23 Saturday  Stop sildenafil (REVATIO) 2 days prior to surgery-Last dose will be on 11-23-23 Saturday  Stop Semaglutide 7 days prior to surgery-Do NOT take again until AFTER surgery  Continue taking all of your other prescription medications up until the day of surgery.  Do NOT take any medication the  day of surgery  No Alcohol for 24 hours before or after surgery.  No Smoking including e-cigarettes for 24 hours before surgery.  No chewable tobacco products for at least 6 hours before surgery.  No nicotine patches on the day of surgery.  Do not use any recreational drugs for at least a week (preferably 2 weeks) before your surgery.  Please be advised that the combination of cocaine and anesthesia may have negative outcomes, up to and including death. If you test positive for cocaine, your surgery will be cancelled.  On the morning of surgery brush your teeth with toothpaste and water, you may rinse your mouth with mouthwash if you wish. Do not swallow any toothpaste or mouthwash.  Use CHG Soap as directed on instruction sheet.  Do not wear jewelry, make-up, hairpins, clips or nail polish.  For welded (permanent) jewelry: bracelets, anklets, waist bands, etc.  Please have this removed prior to surgery.  If it is not removed, there is a chance that hospital personnel will need to cut it off on the day of surgery.  Do not wear lotions, powders, or perfumes.   Do not shave body hair from the neck down 48 hours before surgery.  Contact lenses, hearing aids and dentures may not be worn into surgery.  Do not bring valuables to the hospital. [facility] is not responsible for any missing/lost belongings or valuables.   Notify your doctor if there is any change in your medical condition (cold, fever, infection).  Wear comfortable clothing (specific to your surgery type) to the hospital.  After surgery, you  can help prevent lung complications by doing breathing exercises.  Take deep breaths and cough every 1-2 hours. Your doctor may order a device called an Incentive Spirometer to help you take deep breaths. When coughing or sneezing, hold a pillow firmly against your incision with both hands. This is called "splinting." Doing this helps protect your incision. It also decreases belly  discomfort.  If you are being admitted to the hospital overnight, leave your suitcase in the car. After surgery it may be brought to your room.  In case of increased patient census, it may be necessary for you, the patient, to continue your postoperative care in the Same Day Surgery department.  If you are being discharged the day of surgery, you will not be allowed to drive home. You will need a responsible individual to drive you home and stay with you for 24 hours after surgery.   If you are taking public transportation, you will need to have a responsible individual with you.  Please call the Pre-admissions Testing Dept. at [phone] if you have any questions about these instructions.  Surgery Visitation Policy:  Patients having surgery or a procedure may have two visitors.  Children under the age of 86 must have an adult with them who is not the patient.     Preparing for Surgery with CHLORHEXIDINE  GLUCONATE (CHG) Soap  Chlorhexidine  Gluconate (CHG) Soap  o An antiseptic cleaner that kills germs and bonds with the skin to continue killing germs even after washing  o Used for showering the night before surgery and morning of surgery  Before surgery, you can play an important role by reducing the number of germs on your skin.  CHG (Chlorhexidine  gluconate) soap is an antiseptic cleanser which kills germs and bonds with the skin to continue killing germs even after washing.  Please do not use if you have an allergy to CHG or antibacterial soaps. If your skin becomes reddened/irritated stop using the CHG.  1. Shower the NIGHT BEFORE SURGERY and the MORNING OF SURGERY with CHG soap.  2. If you choose to wash your hair, wash your hair first as usual with your normal shampoo.  3. After shampooing, rinse your hair and body thoroughly to remove the shampoo.  4. Use CHG as you would any other liquid soap. You can apply CHG directly to the skin and wash gently with a scrungie  or a clean washcloth.  5. Apply the CHG soap to your body only from the neck down. Do not use on open wounds or open sores. Avoid contact with your eyes, ears, mouth, and genitals (private parts). Wash face and genitals (private parts) with your normal soap.  6. Wash thoroughly, paying special attention to the area where your surgery will be performed.  7. Thoroughly rinse your body with warm water.  8. Do not shower/wash with your normal soap after using and rinsing off the CHG soap.  9. Pat yourself dry with a clean towel.  10. Wear clean pajamas to bed the night before surgery.  12. Place clean sheets on your bed the night of your first shower and do not sleep with pets.  13. Shower again with the CHG soap on the day of surgery prior to arriving at the hospital.  14. Do not apply any deodorants/lotions/powders.  15. Please wear clean clothes to the hospital.   [employer] to address health-related social needs:  [email or web address]

## 2023-11-26 ENCOUNTER — Encounter
Admission: RE | Disposition: A | Discharge status: Home / Self Care | Payer: Self-pay | Source: Ambulatory Visit | Attending: Surgery

## 2023-11-26 ENCOUNTER — Ambulatory Visit: Admitting: Certified Registered"

## 2023-11-26 ENCOUNTER — Other Ambulatory Visit: Payer: Self-pay

## 2023-11-26 ENCOUNTER — Ambulatory Visit

## 2023-11-26 ENCOUNTER — Ambulatory Visit
Admission: RE | Admit: 2023-11-26 | Discharge: 2023-11-26 | Disposition: A | Discharge status: Home / Self Care | Source: Ambulatory Visit | Attending: Surgery | Admitting: Surgery

## 2023-11-26 ENCOUNTER — Encounter: Payer: Self-pay | Admitting: Surgery

## 2023-11-26 DIAGNOSIS — M19011 Primary osteoarthritis, right shoulder: Secondary | ICD-10-CM | POA: Diagnosis present

## 2023-11-26 DIAGNOSIS — Z7985 Long-term (current) use of injectable non-insulin antidiabetic drugs: Secondary | ICD-10-CM | POA: Diagnosis not present

## 2023-11-26 DIAGNOSIS — Z7984 Long term (current) use of oral hypoglycemic drugs: Secondary | ICD-10-CM | POA: Insufficient documentation

## 2023-11-26 DIAGNOSIS — M25811 Other specified joint disorders, right shoulder: Secondary | ICD-10-CM | POA: Insufficient documentation

## 2023-11-26 DIAGNOSIS — M7521 Bicipital tendinitis, right shoulder: Secondary | ICD-10-CM | POA: Insufficient documentation

## 2023-11-26 DIAGNOSIS — Z01812 Encounter for preprocedural laboratory examination: Secondary | ICD-10-CM

## 2023-11-26 DIAGNOSIS — M25511 Pain in right shoulder: Secondary | ICD-10-CM | POA: Diagnosis present

## 2023-11-26 DIAGNOSIS — G8929 Other chronic pain: Secondary | ICD-10-CM | POA: Diagnosis present

## 2023-11-26 DIAGNOSIS — E119 Type 2 diabetes mellitus without complications: Secondary | ICD-10-CM | POA: Insufficient documentation

## 2023-11-26 HISTORY — PX: BICEPT TENODESIS: SHX5116

## 2023-11-26 HISTORY — PX: POSTERIOR LUMBAR FUSION 2 WITH HARDWARE REMOVAL: SHX7297

## 2023-11-26 LAB — GLUCOSE, CAPILLARY
Glucose-Capillary: 99 mg/dL (ref 70–99)
Glucose-Capillary: 118 mg/dL — ABNORMAL HIGH (ref 70–99)

## 2023-11-26 SURGERY — ARTHROSCOPY, SHOULDER WITH DEBRIDEMENT
Anesthesia: General | Site: Shoulder | Laterality: Right

## 2023-11-26 MED ORDER — BUPIVACAINE-EPINEPHRINE (PF) 0.5% -1:200000 IJ SOLN
INTRAMUSCULAR | Status: DC | PRN
  Administered 2023-11-26: 30 mL

## 2023-11-26 MED ORDER — KETOROLAC TROMETHAMINE 30 MG/ML IJ SOLN
INTRAMUSCULAR | Status: AC
Start: 2023-11-26 — End: 2023-11-26
  Filled 2023-11-26: qty 1

## 2023-11-26 MED ORDER — PHENYLEPHRINE HCL-NACL 20-0.9 MG/250ML-% IV SOLN
INTRAVENOUS | Status: AC
  Filled 2023-11-26: qty 250

## 2023-11-26 MED ORDER — ACETAMINOPHEN 10 MG/ML IV SOLN
INTRAVENOUS | Status: DC | PRN
Start: 2023-11-26 — End: 2023-11-26
  Administered 2023-11-26: 1000 mg via INTRAVENOUS

## 2023-11-26 MED ORDER — ONDANSETRON HCL 4 MG/2ML IJ SOLN
INTRAMUSCULAR | Status: DC | PRN
  Administered 2023-11-26: 4 mg via INTRAVENOUS

## 2023-11-26 MED ORDER — CHLORHEXIDINE GLUCONATE 0.12 % MT SOLN
15.0000 mL | Freq: Once | OROMUCOSAL | Status: AC
  Administered 2023-11-26: 15 mL via OROMUCOSAL

## 2023-11-26 MED ORDER — ONDANSETRON HCL 4 MG/2ML IJ SOLN
INTRAMUSCULAR | Status: AC
Start: 2023-11-26 — End: 2023-11-26
  Filled 2023-11-26: qty 6

## 2023-11-26 MED ORDER — RINGERS IRRIGATION IR SOLN
Status: DC | PRN
Start: 2023-11-26 — End: 2023-11-26
  Administered 2023-11-26: 3000 mL

## 2023-11-26 MED ORDER — PROPOFOL 10 MG/ML IV BOLUS
INTRAVENOUS | Status: DC | PRN
  Administered 2023-11-26: 200 mg via INTRAVENOUS

## 2023-11-26 MED ORDER — SODIUM CHLORIDE 0.9 % IV SOLN: INTRAVENOUS | Status: DC

## 2023-11-26 MED ORDER — ONDANSETRON HCL 4 MG/2ML IJ SOLN: 4.0000 mg | Freq: Four times a day (QID) | INTRAMUSCULAR | Status: DC | PRN

## 2023-11-26 MED ORDER — DEXAMETHASONE SODIUM PHOSPHATE 10 MG/ML IJ SOLN
INTRAMUSCULAR | Status: AC
  Filled 2023-11-26: qty 3

## 2023-11-26 MED ORDER — OXYCODONE HCL 5 MG/5ML PO SOLN: 5.0000 mg | Freq: Once | ORAL | Status: DC | PRN

## 2023-11-26 MED ORDER — FENTANYL CITRATE (PF) 100 MCG/2ML IJ SOLN: 25.0000 ug | INTRAMUSCULAR | Status: DC | PRN

## 2023-11-26 MED ORDER — METOCLOPRAMIDE HCL 10 MG PO TABS: 5.0000 mg | ORAL_TABLET | Freq: Three times a day (TID) | ORAL | Status: DC | PRN

## 2023-11-26 MED ORDER — OXYCODONE HCL 5 MG PO TABS: 5.0000 mg | ORAL_TABLET | ORAL | Status: DC | PRN

## 2023-11-26 MED ORDER — ACETAMINOPHEN 325 MG PO TABS: 325.0000 mg | ORAL_TABLET | Freq: Four times a day (QID) | ORAL | Status: DC | PRN

## 2023-11-26 MED ORDER — METOCLOPRAMIDE HCL 5 MG/ML IJ SOLN: 5.0000 mg | Freq: Three times a day (TID) | INTRAMUSCULAR | Status: DC | PRN

## 2023-11-26 MED ORDER — PHENYLEPHRINE 80 MCG/ML (10ML) SYRINGE FOR IV PUSH (FOR BLOOD PRESSURE SUPPORT)
PREFILLED_SYRINGE | INTRAVENOUS | Status: DC | PRN
Start: 2023-11-26 — End: 2023-11-26
  Administered 2023-11-26: 160 ug via INTRAVENOUS

## 2023-11-26 MED ORDER — MIDAZOLAM HCL 2 MG/2ML IJ SOLN
INTRAMUSCULAR | Status: AC
  Filled 2023-11-26: qty 2

## 2023-11-26 MED ORDER — BUPIVACAINE LIPOSOME 1.3 % IJ SUSP
INTRAMUSCULAR | Status: AC
  Filled 2023-11-26: qty 20

## 2023-11-26 MED ORDER — EPINEPHRINE PF 1 MG/ML IJ SOLN
INTRAMUSCULAR | Status: AC
  Filled 2023-11-26: qty 1

## 2023-11-26 MED ORDER — FENTANYL CITRATE (PF) 100 MCG/2ML IJ SOLN
INTRAMUSCULAR | Status: DC | PRN
  Administered 2023-11-26 (×2): 50 ug via INTRAVENOUS

## 2023-11-26 MED ORDER — MIDAZOLAM HCL 2 MG/2ML IJ SOLN
1.0000 mg | INTRAMUSCULAR | Status: AC | PRN
  Administered 2023-11-26 (×2): 1 mg via INTRAVENOUS

## 2023-11-26 MED ORDER — FENTANYL CITRATE PF 50 MCG/ML IJ SOSY
50.0000 ug | PREFILLED_SYRINGE | Freq: Once | INTRAMUSCULAR | Status: AC
  Administered 2023-11-26: 50 ug via INTRAVENOUS

## 2023-11-26 MED ORDER — SUGAMMADEX SODIUM 200 MG/2ML IV SOLN
INTRAVENOUS | Status: DC | PRN
  Administered 2023-11-26: 180 mg via INTRAVENOUS

## 2023-11-26 MED ORDER — BUPIVACAINE-EPINEPHRINE (PF) 0.5% -1:200000 IJ SOLN
INTRAMUSCULAR | Status: AC
  Filled 2023-11-26: qty 30

## 2023-11-26 MED ORDER — EPINEPHRINE PF 1 MG/ML IJ SOLN
INTRAMUSCULAR | Status: DC | PRN
Start: 2023-11-26 — End: 2023-11-26
  Administered 2023-11-26: 1 mg

## 2023-11-26 MED ORDER — CEFAZOLIN SODIUM-DEXTROSE 2-4 GM/100ML-% IV SOLN
2.0000 g | INTRAVENOUS | Status: AC
  Administered 2023-11-26: 2 g via INTRAVENOUS

## 2023-11-26 MED ORDER — PROPOFOL 10 MG/ML IV BOLUS
INTRAVENOUS | Status: AC
  Filled 2023-11-26: qty 20

## 2023-11-26 MED ORDER — CEFAZOLIN SODIUM-DEXTROSE 2-4 GM/100ML-% IV SOLN
INTRAVENOUS | Status: AC
  Filled 2023-11-26: qty 100

## 2023-11-26 MED ORDER — DEXAMETHASONE SODIUM PHOSPHATE 10 MG/ML IJ SOLN
INTRAMUSCULAR | Status: DC | PRN
Start: 2023-11-26 — End: 2023-11-26
  Administered 2023-11-26: 5 mg via INTRAVENOUS

## 2023-11-26 MED ORDER — ACETAMINOPHEN 10 MG/ML IV SOLN
INTRAVENOUS | Status: AC
  Filled 2023-11-26: qty 100

## 2023-11-26 MED ORDER — KETOROLAC TROMETHAMINE 30 MG/ML IJ SOLN
30.0000 mg | Freq: Once | INTRAMUSCULAR | Status: AC
  Administered 2023-11-26: 30 mg via INTRAVENOUS

## 2023-11-26 MED ORDER — BUPIVACAINE HCL (PF) 0.5 % IJ SOLN
INTRAMUSCULAR | Status: DC | PRN
Start: 2023-11-26 — End: 2023-11-26
  Administered 2023-11-26: 10 mL

## 2023-11-26 MED ORDER — ROCURONIUM BROMIDE 10 MG/ML (PF) SYRINGE
PREFILLED_SYRINGE | INTRAVENOUS | Status: AC
  Filled 2023-11-26: qty 20

## 2023-11-26 MED ORDER — ONDANSETRON HCL 4 MG PO TABS: 4.0000 mg | ORAL_TABLET | Freq: Four times a day (QID) | ORAL | Status: DC | PRN

## 2023-11-26 MED ORDER — PHENYLEPHRINE HCL-NACL 20-0.9 MG/250ML-% IV SOLN
INTRAVENOUS | Status: DC | PRN
  Administered 2023-11-26: 25 ug/min via INTRAVENOUS

## 2023-11-26 MED ORDER — CHLORHEXIDINE GLUCONATE 0.12 % MT SOLN
OROMUCOSAL | Status: AC
  Filled 2023-11-26: qty 15

## 2023-11-26 MED ORDER — FENTANYL CITRATE (PF) 100 MCG/2ML IJ SOLN
INTRAMUSCULAR | Status: AC
  Filled 2023-11-26: qty 2

## 2023-11-26 MED ORDER — BUPIVACAINE HCL (PF) 0.5 % IJ SOLN
INTRAMUSCULAR | Status: AC
  Filled 2023-11-26: qty 10

## 2023-11-26 MED ORDER — ORAL CARE MOUTH RINSE: 15.0000 mL | Freq: Once | OROMUCOSAL | Status: AC

## 2023-11-26 MED ORDER — FENTANYL CITRATE PF 50 MCG/ML IJ SOSY
PREFILLED_SYRINGE | INTRAMUSCULAR | Status: AC
  Filled 2023-11-26: qty 1

## 2023-11-26 MED ORDER — LIDOCAINE HCL (PF) 2 % IJ SOLN
INTRAMUSCULAR | Status: AC
  Filled 2023-11-26: qty 10

## 2023-11-26 MED ORDER — DEXMEDETOMIDINE HCL IN NACL 80 MCG/20ML IV SOLN
INTRAVENOUS | Status: DC | PRN
  Administered 2023-11-26: 8 ug via INTRAVENOUS

## 2023-11-26 MED ORDER — BUPIVACAINE LIPOSOME 1.3 % IJ SUSP
INTRAMUSCULAR | Status: DC | PRN
  Administered 2023-11-26: 20 mL

## 2023-11-26 MED ORDER — ROCURONIUM BROMIDE 100 MG/10ML IV SOLN
INTRAVENOUS | Status: DC | PRN
Start: 2023-11-26 — End: 2023-11-26
  Administered 2023-11-26: 60 mg via INTRAVENOUS

## 2023-11-26 MED ORDER — EPHEDRINE SULFATE-NACL 50-0.9 MG/10ML-% IV SOSY
PREFILLED_SYRINGE | INTRAVENOUS | Status: DC | PRN
  Administered 2023-11-26: 5 mg via INTRAVENOUS

## 2023-11-26 MED ORDER — OXYCODONE HCL 5 MG PO TABS: 5.0000 mg | ORAL_TABLET | Freq: Once | ORAL | Status: DC | PRN

## 2023-11-26 SURGICAL SUPPLY — 42 items
ANCHOR JUGGERKNOT SOFT 2.9 (Anchor) IMPLANT
ANCHOR JUGGERKNOT WTAP NDL 2.9 (Anchor) IMPLANT
ANCHOR SUT QUATTRO KNTLS 4.5 (Anchor) IMPLANT
ANCHOR SUT W/ ORTHOCORD (Anchor) IMPLANT
BIT DRILL JUGRKNT W/NDL BIT2.9 (DRILL) IMPLANT
BLADE FULL RADIUS 3.5 (BLADE) ×1 IMPLANT
BUR ACROMIONIZER 4.0 (BURR) ×1 IMPLANT
CHLORAPREP W/TINT 26 (MISCELLANEOUS) ×1 IMPLANT
COVER MAYO STAND STRL (DRAPES) ×1 IMPLANT
ELECT CAUTERY BLADE 6.4 (BLADE) IMPLANT
ELECTRODE REM PT RTRN 9FT ADLT (ELECTROSURGICAL) ×1 IMPLANT
GAUZE SPONGE 4X4 12PLY STRL (GAUZE/BANDAGES/DRESSINGS) ×1 IMPLANT
GAUZE XEROFORM 1X8 LF (GAUZE/BANDAGES/DRESSINGS) ×1 IMPLANT
GLOVE BIO SURGEON STRL SZ7.5 (GLOVE) ×2 IMPLANT
GLOVE BIO SURGEON STRL SZ8 (GLOVE) ×2 IMPLANT
GLOVE BIOGEL PI IND STRL 8 (GLOVE) ×1 IMPLANT
GLOVE INDICATOR 8.0 STRL GRN (GLOVE) ×1 IMPLANT
GOWN STRL REUS W/ TWL LRG LVL3 (GOWN DISPOSABLE) ×1 IMPLANT
GOWN STRL REUS W/ TWL XL LVL3 (GOWN DISPOSABLE) ×1 IMPLANT
GRASPER SUT 15 45D LOW PRO (SUTURE) IMPLANT
IMMOBILIZER SHDR LG LX 900803 (SOFTGOODS) IMPLANT
IV LR IRRIG 3000ML ARTHROMATIC (IV SOLUTION) ×1 IMPLANT
MANIFOLD NEPTUNE II (INSTRUMENTS) ×2 IMPLANT
MASK FACE SPIDER DISP (MASK) ×1 IMPLANT
MAT ABSORB FLUID 56X50 GRAY (MISCELLANEOUS) ×1 IMPLANT
PACK ARTHROSCOPY SHOULDER (MISCELLANEOUS) ×1 IMPLANT
PAD ABD DERMACEA PRESS 5X9 (GAUZE/BANDAGES/DRESSINGS) ×2 IMPLANT
PENCIL SMOKE EVACUATOR (MISCELLANEOUS) IMPLANT
SLEEVE REMOTE CONTROL 5X12 (DRAPES) IMPLANT
SLING ARM LRG DEEP (SOFTGOODS) ×1 IMPLANT
SLING ULTRA II LG (MISCELLANEOUS) ×1 IMPLANT
SLING ULTRA II M (MISCELLANEOUS) IMPLANT
SPONGE T-LAP 18X18 ~~LOC~~+RFID (SPONGE) ×1 IMPLANT
STAPLER SKIN PROX 35W (STAPLE) ×1 IMPLANT
STRAP SAFETY 5IN WIDE (MISCELLANEOUS) ×1 IMPLANT
SUT ETHIBOND 0 MO6 C/R (SUTURE) ×1 IMPLANT
SUT VIC AB 2-0 CT1 TAPERPNT 27 (SUTURE) ×2 IMPLANT
TAPE MICROFOAM 4IN (TAPE) ×1 IMPLANT
TRAP FLUID SMOKE EVACUATOR (MISCELLANEOUS) ×1 IMPLANT
TUBE SET DOUBLEFLO INFLOW (TUBING) ×1 IMPLANT
WAND WEREWOLF FLOW 90D (MISCELLANEOUS) ×1 IMPLANT
WATER STERILE IRR 500ML POUR (IV SOLUTION) ×1 IMPLANT

## 2023-11-26 NOTE — Transfer of Care (Signed)
 Immediate Anesthesia Transfer of Care Note  Patient: Christian Vazquez  Procedure(s) Performed: ARTHROSCOPY, SHOULDER WITH DEBRIDEMENT (Right: Shoulder) TENODESIS, BICEPS (Right: Shoulder)  Patient Location: PACU  Anesthesia Type:General  Level of Consciousness: drowsy  Airway & Oxygen Therapy: Patient Spontanous Breathing and Patient connected to face mask oxygen  Post-op Assessment: Report given to RN and Post -op Vital signs reviewed and stable  Post vital signs: Reviewed and stable  Last Vitals:  Vitals Value Taken Time  BP 103/78 11/26/23 15:31  Temp 36.1 C 11/26/23 15:31  Pulse 69 11/26/23 15:32  Resp 13 11/26/23 15:32  SpO2 97 % 11/26/23 15:32  Vitals shown include unfiled device data.  Last Pain:  Vitals:   11/26/23 1113  TempSrc: Temporal  PainSc: 0-No pain         Complications: No notable events documented.

## 2023-11-26 NOTE — Anesthesia Procedure Notes (Signed)
 Procedure Name: Intubation Date/Time: 11/26/2023 1:47 PM  Performed by: Lennie Lamarr HERO, CRNAPre-anesthesia Checklist: Patient identified, Emergency Drugs available, Suction available and Patient being monitored Patient Re-evaluated:Patient Re-evaluated prior to induction Oxygen Delivery Method: Circle System Utilized Preoxygenation: Pre-oxygenation with 100% oxygen Induction Type: IV induction Ventilation: Mask ventilation without difficulty Laryngoscope Size: McGrath and 4 Grade View: Grade I Tube type: Oral Tube size: 7.5 mm Number of attempts: 1 Airway Equipment and Method: Stylet and Oral airway Placement Confirmation: ETT inserted through vocal cords under direct vision, positive ETCO2 and breath sounds checked- equal and bilateral Secured at: 21 cm Tube secured with: Tape Dental Injury: Teeth and Oropharynx as per pre-operative assessment

## 2023-11-26 NOTE — Discharge Instructions (Addendum)
 Orthopedic discharge instructions: Keep dressing dry and intact.  May shower after dressing changed on post-op day #4 (Saturday).  Cover staples with Band-Aids after drying off. Apply ice frequently to shoulder. Take ibuprofen 600-800 mg TID with meals for 3-5 days, then as necessary. Take oxycodone  as prescribed when needed.  May supplement with ES Tylenol  if necessary. Keep shoulder immobilizer on at all times except may remove for bathing purposes. Follow-up in 10-14 days or as scheduled.

## 2023-11-26 NOTE — H&P (Signed)
 History of Present Illness: Christian Vazquez is a 58 y.o. male who presents today for his history and physical for upcoming right shoulder arthroscopy with debridement, decompression and mini open biceps tenodesis. Surgery is scheduled with Dr. Edie on 11/26/2023. The patient denies any changes in his medical history since he was last evaluated. He reports a 3 out of 10 pain score in the right shoulder. He denies any personal history of heart attack, stroke, asthma or COPD. Denies any history of blood clots in the past. He is a diabetic, most recent A1c was 6.4. The patient has undergone a ultrasound evaluation of the right shoulder which demonstrated no evidence of a full-thickness rotator cuff tear. He did have some mild tendinopathic changes along the rotator cuff with biceps tendinopathy.  Past Medical History: Hyperlipidemia  T2DM (type 2 diabetes mellitus) (CMS/HHS-HCC)   Past Surgical History: COLONOSCOPY 08/23/2020 (Diverticulosis/Otherwise normal colon/Repeat 63yrs/CTL)  ARTHROSCOPY SHOULDER Left 06/13/2023 (Dr. Edie)   Past Family History: Colon polyps Father   Medications: cholecalciferol (VITAMIN D3) 400 unit tablet Take 800 Units by mouth once daily  cyanocobalamin (VITAMIN B12) 1000 MCG tablet Take 1,000 mcg by mouth once daily  ezetimibe (ZETIA) 10 mg tablet Take 1 tablet (10 mg total) by mouth once daily 30 tablet 11  glimepiride (AMARYL) 2 MG tablet Take 1 tablet (2 mg total) by mouth daily with breakfast 90 tablet 3  Herbal Supplement Herbal Name: Parasite Proactive  lisinopriL (ZESTRIL) 5 MG tablet TAKE 1 TABLET BY MOUTH DAILY 90 tablet 3  metFORMIN (GLUCOPHAGE-XR) 750 MG XR tablet Take 1 tablet (750 mg total) by mouth once daily 90 tablet 3  OMEGA-3/DHA/EPA/FISH OIL (FISH OIL-OMEGA-3 FATTY ACIDS) 300-1,000 mg capsule Take 1 g by mouth.  semaglutide (OZEMPIC) 0.25 mg or 0.5 mg (2 mg/3 mL) pen injector Inject 0.75 mLs (0.5 mg total) subcutaneously once a week 3 mL 11    Allergies: Vit E-Nonoxynol 9-Aloe Vera Rash   Review of Systems:  A comprehensive 14 point ROS was performed, reviewed by me today, and the pertinent orthopaedic findings are documented in the HPI.  Physical Exam: BP 122/76  Ht 177.8 cm (5' 10)  Wt 89.4 kg (197 lb)  BMI 28.27 kg/m  General/Constitutional: The patient appears to be well-nourished, well-developed, and in no acute distress. Neuro/Psych: Normal mood and affect, oriented to person, place and time. Eyes: Non-icteric. Pupils are equal, round, and reactive to light, and exhibit synchronous movement. ENT: Unremarkable. Lymphatic: No palpable adenopathy. Respiratory: Lungs clear to auscultation, Normal chest excursion, No wheezes, and Non-labored breathing Cardiovascular: Regular rate and rhythm. No murmurs. and No edema, swelling or tenderness, except as noted in detailed exam. Integumentary: No impressive skin lesions present, except as noted in detailed exam. Musculoskeletal: Unremarkable, except as noted in detailed exam.  General: Well developed, well nourished 58 y.o. male in no apparent distress. Normal affect. Normal communication. Patient answers questions appropriately. The patient has a normal gait. There is no antalgic component. There is no hip lurch.   Right Upper Extremity: Examination of the right shoulder and arm showed no bony abnormality or edema. The patient does have full active forward flexion at today's visit. With abduction of the right shoulder he is able to reach close to 160 degrees with moderate discomfort. With the right shoulder abducted 90 degrees he can tolerate external rotation close to 75 degrees, internal rotation 60 degrees.. The patient does have a positive Hawkins test and positive impingement test of the right upper extremity. The patient has  a negative drop arm test. The patient does have moderate tenderness with palpation along the right subacromial space and along the anterior aspect of  the right shoulder over the proximal bicep tendon. The patient has no instability of the shoulder with anterior-posterior motion. There is a negative sulcus sign. The rotator cuff muscle strength is 5/5 with supraspinatus, 5/5 with internal rotation, and 5/5 with external rotation. There is no crepitus with range of motion activities.   Neurological: The patient has sensation that is intact to light touch and pinprick bilaterally. The patient has normal grip strength. The patient has full biceps, wrist extension, grip, and interosseous strength. The patient has 2+ DTRs bilaterally.  Vascular: The patient has less than 2 second capillary refill. The patient has normal ulnar and radial pulses. The patient has normal warmth to touch.   Imaging: True AP, Y-scapular, and axillary views of the right shoulder were obtained in a previous visit and reviewed by me today. These films demonstrate moderate degenerative changes with a moderate osteophyte off of the inferior aspect of the humeral head. The humeral joint space appears to be relatively well-maintained. Subacromial space is well-maintained.. There is no subacromial or infra-clavicular spurring. He demonstrates a Type I acromion.  Ultrasound evaluation of the right shoulder was performed by Dr. Sharrie.  IMPRESSION: 1) No apparent full-thickness rotator cuff tear. Mild supraspinatus, infraspinatus, teres minor tendinopathic changes. 2) Mild-moderate tendinopathic changes to the subscapularis with possible partial articular sided tearing and punctate calcific tendon. 3) Mild proximal biceps tendinopathy with possible tenosynovitis. 4) Mild AC joint arthritis.   Impression: 1. Primary osteoarthritis of right shoulder. 2. Tendinopathy of right rotator cuff. 3. Tendinopathy of right biceps tendon.  Plan:  1. Treatment options were discussed today with the patient. 2. The patient is scheduled for a right shoulder arthroscopy with debridement,  decompression and a mini open biceps tenodesis. Surgery scheduled with Dr. Edie on 11/26/2023. 3. The patient was instructed on the risk and benefits of surgical intervention and wishes to proceed at this time. 4. This document will serve as a surgical history and physical for the patient. 5. The patient will follow-up per standard postop protocol. They can call the clinic they have any questions, new symptoms develop or symptoms worsen.  The procedure was discussed with the patient, as were the potential risks (including bleeding, infection, nerve and/or blood vessel injury, persistent or recurrent pain, failure of the repair, progression of arthritis, need for further surgery, blood clots, strokes, heart attacks and/or arhythmias, pneumonia, etc.) and benefits. The patient states his understanding and wishes to proceed.    H&P reviewed and patient re-examined. No changes.

## 2023-11-26 NOTE — Op Note (Signed)
 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  Post-Op Diagnosis:   Impingement/tendinopathy with degenerative labral tearing, degenerative joint disease, and biceps tendinopathy, right shoulder.  Procedure:   Extensive arthroscopic debridement, arthroscopic subacromial decompression, and mini-open biceps tenodesis, right shoulder.  Anesthesia:   General endotracheal with interscalene block using Exparel  placed preoperatively by the anesthesiologist.  Surgeon:   DOROTHA Reyes Maltos, MD  Assistant:   [place] Lee Napolitano, PA-S  Findings:   As above.  There were areas of grade III-IV chondromalacia involving the central portion of the humeral head, as well as grade 3 chondromalacial changes involving the inferior portion of the glenoid. There was moderate labral fraying anteriorly, superiorly, and posterior superiorly without frank detachment from the glenoid rim. The biceps tendon demonstrated moderate lip sticking without partial or full-thickness tearing. There was extensive synovitis anteriorly, superiorly, and posteriorly. In addition, there was mild fraying of the insertional fibers of the supraspinatus and infraspinatus tendons, as well as of the superior insertional fibers of the subscapularis tendon without significant compromise of the respective footprints.  Complications:   None  Estimated blood loss:   20 cc  Fluids:   300 cc  Tourniquet time:   None  Drains:   None  Closure:   Staples      Brief clinical note:   The patient is a 58 year old male with a history of gradually worsening right shoulder pain. The patient's symptoms have progressed despite medications, activity modification, etc. The patient's history and examination are consistent with impingement/tendinopathy with underlying degenerative joint disease and biceps tendinopathy. These findings  were confirmed by preoperative ultrasound imaging. The patient presents at this time for definitive management of these shoulder symptoms.  Procedure:   The patient underwent placement of an interscalene block using Exparel  by the anesthesiologist in the preoperative holding area before being brought into the operating room and lain in the supine position. The patient then underwent general endotracheal intubation and anesthesia before being repositioned in the beach chair position using the beach chair positioner. The right shoulder and upper extremity were prepped with ChloraPrep solution before being draped sterilely. Preoperative antibiotics were administered. A timeout was performed to confirm the proper surgical site before the expected portal sites and incision site were injected with 0.5% Sensorcaine  with epinephrine .   A posterior portal was created and the glenohumeral joint thoroughly inspected with the findings as described above. An anterior portal was created using an outside-in technique. The labrum and rotator cuff were further probed, again confirming the above-noted findings. The areas of labral fraying were debrided back to stable margins using the full-radius resector, as were areas of insertional fiber fraying of the supraspinatus, infraspinatus, and subscapularis tendons. The areas of loose articular cartilage in the involving the humeral head and glenoid were debrided back to stable margins using the full-radius resector. Areas of synovitis also were debrided back to stable margins using the full-radius dissector. The ArthroCare wand was inserted and used to release the biceps tendon from his labral anchor. It also was used to obtain hemostasis as well as to anneal the labrum superiorly and anteriorly. The instruments were removed from the joint after suctioning the excess fluid.  The camera was repositioned through the posterior portal into the subacromial space. A separate lateral  portal was created using an outside-in technique. The 3.5 mm full-radius resector was introduced and used to perform a subtotal  bursectomy. The ArthroCare wand was then inserted and used to remove the periosteal tissue off the undersurface of the anterior third of the acromion as well as to recess the coracoacromial ligament from its attachment along the anterior and lateral margins of the acromion. The 4.0 mm acromionizing bur was introduced and used to complete the decompression by removing the undersurface of the anterior third of the acromion. The full radius resector was reintroduced to remove any residual bony debris before the ArthroCare wand was reintroduced to obtain hemostasis. The instruments were then removed from the subacromial space after suctioning the excess fluid.  An approximately 4-5 cm incision was made over the anterolateral aspect of the shoulder beginning at the anterolateral corner of the acromion and extending distally in line with the bicipital groove. This incision was carried down through the subcutaneous tissues to expose the deltoid fascia. The raphae between the anterior and middle thirds was identified and this plane developed to provide access into the subacromial space. Additional bursal tissues were debrided sharply using Metzenbaum scissors. The rotator cuff tear was identified and carefully inspected.  There is no evidence for any partial full-thickness tearing involving the bursal surface of the visualized rotator cuff.   The bicipital groove was identified by palpation and opened for 1-1.5 cm. The biceps tendon stump was retrieved through this defect. The floor of the bicipital groove was roughened with a curet before a self tapping Biomet 2.9 mm JuggerKnot anchor was inserted. Both sets of sutures were passed through the biceps tendon and tied securely to effect the tenodesis. The bicipital sheath was reapproximated using two #0 Ethibond interrupted sutures, incorporating  the biceps tendon to further reinforce the tenodesis.  The wound was copiously irrigated with sterile saline solution before the deltoid raphae was reapproximated using 2-0 Vicryl interrupted sutures. The subcutaneous tissues were closed in two layers using 2-0 Vicryl interrupted sutures before the skin was closed using staples. The portal sites also were closed using staples. A sterile bulky dressing was applied to the shoulder before the arm was placed into a shoulder immobilizer. The patient was then awakened, extubated, and returned to the recovery room in satisfactory condition after tolerating the procedure well.

## 2023-11-26 NOTE — Anesthesia Postprocedure Evaluation (Signed)
 Anesthesia Post Note  Patient: Christian Vazquez  Procedure(s) Performed: ARTHROSCOPY, SHOULDER WITH DEBRIDEMENT (Right: Shoulder) TENODESIS, BICEPS (Right: Shoulder)  Patient location during evaluation: PACU Anesthesia Type: General Level of consciousness: awake and alert Pain management: pain level controlled Vital Signs Assessment: post-procedure vital signs reviewed and stable Respiratory status: spontaneous breathing, nonlabored ventilation, respiratory function stable and patient connected to nasal cannula oxygen Cardiovascular status: blood pressure returned to baseline and stable Postop Assessment: no apparent nausea or vomiting Anesthetic complications: no   No notable events documented.   Last Vitals:  Vitals:   11/26/23 1536 11/26/23 1545  BP:  118/74  Pulse: 69 76  Resp: 10 13  Temp:    SpO2: 99% 99%    Last Pain:  Vitals:   11/26/23 1113  TempSrc: Temporal  PainSc: 0-No pain                 Debby Mines

## 2023-11-26 NOTE — Anesthesia Preprocedure Evaluation (Signed)
 Anesthesia Evaluation  Patient identified by MRN, date of birth, ID band Patient awake    Reviewed: Allergy & Precautions, H&P , NPO status , Patient's Chart, lab work & pertinent test results  Airway Mallampati: II  TM Distance: >3 FB Neck ROM: full    Dental no notable dental hx.    Pulmonary neg pulmonary ROS   Pulmonary exam normal        Cardiovascular negative cardio ROS Normal cardiovascular exam     Neuro/Psych negative neurological ROS  negative psych ROS   GI/Hepatic negative GI ROS, Neg liver ROS,,,  Endo/Other  diabetes, Well Controlled, Type 2    Renal/GU      Musculoskeletal   Abdominal   Peds  Hematology negative hematology ROS (+)   Anesthesia Other Findings Past Medical History: No date: Actinic keratosis No date: Chronic left shoulder pain No date: COVID-19 No date: Diverticulosis No date: DM (diabetes mellitus), type 2 (HCC) No date: Hyperlipidemia No date: OA (osteoarthritis) No date: Pneumonia due to COVID-19 virus 03/06/2022: Squamous cell carcinoma of skin     Comment:  Left Forearm - Posterior, [facility]  Past Surgical History: No date: COLONOSCOPY 08/23/2020: COLONOSCOPY WITH PROPOFOL ; N/A     Comment:  Procedure: COLONOSCOPY WITH PROPOFOL ;  Surgeon:               Maryruth Ole DASEN, MD;  Location: [facility] ENDOSCOPY;                Service: Endoscopy;  Laterality: N/A;  covid positive               06/01/2020 No date: TONSILLECTOMY     Comment:  as a child No date: WISDOM TOOTH EXTRACTION     Reproductive/Obstetrics negative OB ROS                              Anesthesia Physical Anesthesia Plan  ASA: 2  Anesthesia Plan: General ETT   Post-op Pain Management:    Induction: Intravenous  PONV Risk Score and Plan: 2 and Ondansetron , Dexamethasone  and Midazolam   Airway Management Planned: Oral ETT  Additional Equipment:   Intra-op Plan:    Post-operative Plan: Extubation in OR  Informed Consent: I have reviewed the patients History and Physical, chart, labs and discussed the procedure including the risks, benefits and alternatives for the proposed anesthesia with the patient or authorized representative who has indicated his/her understanding and acceptance.     Dental Advisory Given  Plan Discussed with: Anesthesiologist, CRNA and Surgeon  Anesthesia Plan Comments: (Patient consented for risks of anesthesia including but not limited to:  - adverse reactions to medications - damage to eyes, teeth, lips or other oral mucosa - nerve damage due to positioning  - sore throat or hoarseness - Damage to heart, brain, nerves, lungs, other parts of body or loss of life  Patient voiced understanding and assent.)        Anesthesia Quick Evaluation

## 2023-11-26 NOTE — Anesthesia Procedure Notes (Signed)
 Anesthesia Regional Block: Interscalene brachial plexus block   Pre-Anesthetic Checklist: , timeout performed,  Correct Patient, Correct Site, Correct Laterality,  Correct Procedure, Correct Position, site marked,  Risks and benefits discussed,  Surgical consent,  Pre-op evaluation,  At surgeon's request and post-op pain management  Laterality: Right  Prep: chloraprep       Needles:  Injection technique: Single-shot  Needle Type: Echogenic Needle     Needle Length: 4cm  Needle Gauge: 25     Additional Needles:   Procedures:,,,, ultrasound used (permanent image in chart),,    Narrative:  Start time: 11/26/2023 12:05 PM End time: 11/26/2023 12:07 PM Injection made incrementally with aspirations every 5 mL.  Performed by: Personally  Anesthesiologist: Leavy Ned, MD  Additional Notes: Patient's chart reviewed and they were deemed appropriate candidate for procedure, at surgeon's request. Patient educated about risks, benefits, and alternatives of the block including but not limited to: temporary or permanent nerve damage, bleeding, infection, damage to surround tissues, pneumothorax, hemidiaphragmatic paralysis, unilateral Horner's syndrome, block failure, local anesthetic toxicity. Patient expressed understanding. A formal time-out was conducted consistent with institution rules.  Monitors were applied, and minimal sedation used (see nursing record). The site was prepped with skin prep and allowed to dry, and sterile gloves were used. A high frequency linear ultrasound probe with probe cover was utilized throughout. C5-7 nerve roots located and appeared anatomically normal, local anesthetic injected around them, and echogenic block needle trajectory was monitored throughout. Aspiration performed every 5ml. Lung and blood vessels were avoided. All injections were performed without resistance and free of blood and paresthesias. The patient tolerated the procedure well.  Injectate:  20ml exparel  + 10ml 0.5% bupivacaine 

## 2023-11-29 ENCOUNTER — Encounter: Payer: Self-pay | Admitting: Surgery

## 2024-01-28 ENCOUNTER — Encounter: Payer: Self-pay | Admitting: Dermatology

## 2024-01-28 ENCOUNTER — Ambulatory Visit (INDEPENDENT_AMBULATORY_CARE_PROVIDER_SITE_OTHER): Payer: BC Managed Care – PPO | Admitting: Dermatology

## 2024-01-28 DIAGNOSIS — L821 Other seborrheic keratosis: Secondary | ICD-10-CM | POA: Diagnosis not present

## 2024-01-28 DIAGNOSIS — Z1283 Encounter for screening for malignant neoplasm of skin: Secondary | ICD-10-CM | POA: Diagnosis not present

## 2024-01-28 DIAGNOSIS — L719 Rosacea, unspecified: Secondary | ICD-10-CM

## 2024-01-28 DIAGNOSIS — L814 Other melanin hyperpigmentation: Secondary | ICD-10-CM | POA: Diagnosis not present

## 2024-01-28 DIAGNOSIS — Z7189 Other specified counseling: Secondary | ICD-10-CM

## 2024-01-28 DIAGNOSIS — L578 Other skin changes due to chronic exposure to nonionizing radiation: Secondary | ICD-10-CM | POA: Diagnosis not present

## 2024-01-28 DIAGNOSIS — Z8589 Personal history of malignant neoplasm of other organs and systems: Secondary | ICD-10-CM

## 2024-01-28 DIAGNOSIS — L905 Scar conditions and fibrosis of skin: Secondary | ICD-10-CM

## 2024-01-28 DIAGNOSIS — I781 Nevus, non-neoplastic: Secondary | ICD-10-CM

## 2024-01-28 DIAGNOSIS — D229 Melanocytic nevi, unspecified: Secondary | ICD-10-CM

## 2024-01-28 DIAGNOSIS — W908XXA Exposure to other nonionizing radiation, initial encounter: Secondary | ICD-10-CM

## 2024-01-28 NOTE — Progress Notes (Signed)
 Follow-Up Visit   Subjective  Christian Vazquez is a 58 y.o. male who presents for the following: Skin Cancer Screening and Full Body Skin Exam No specific concerns today. Hx of AK and SCC 03/06/22 on the L Forearm-Posterior, [facility]  The patient presents for Total-Body Skin Exam (TBSE) for skin cancer screening and mole check. The patient has spots, moles and lesions to be evaluated, some may be new or changing and the patient may have concern these could be cancer.  The following portions of the chart were reviewed this encounter and updated as appropriate: medications, allergies, medical history  Review of Systems:  No other skin or systemic complaints except as noted in HPI or Assessment and Plan.  Objective  Well appearing patient in no apparent distress; mood and affect are within normal limits.  A full examination was performed including scalp, head, eyes, ears, nose, lips, neck, chest, axillae, abdomen, back, buttocks, bilateral upper extremities, bilateral lower extremities, hands, feet, fingers, toes, fingernails, and toenails. All findings within normal limits unless otherwise noted below.   Relevant physical exam findings are noted in the Assessment and Plan.   Assessment & Plan   SKIN CANCER SCREENING PERFORMED TODAY.  ACTINIC DAMAGE - Chronic condition, secondary to cumulative UV/sun exposure - diffuse scaly erythematous macules with underlying dyspigmentation - Recommend daily broad spectrum sunscreen SPF 30+ to sun-exposed areas, reapply every 2 hours as needed.  - Staying in the shade or wearing long sleeves, sun glasses (UVA+UVB protection) and wide brim hats (4-inch brim around the entire circumference of the hat) are also recommended for sun protection.  - Call for new or changing lesions.  LENTIGINES, SEBORRHEIC KERATOSES, HEMANGIOMAS - Benign normal skin lesions - Benign-appearing - Call for any changes  MELANOCYTIC NEVI - Tan-brown and/or pink-flesh-colored  symmetric macules and papules - Benign appearing on exam today - Observation - Call clinic for new or changing moles - Recommend daily use of broad spectrum spf 30+ sunscreen to sun-exposed areas.   Dog bite Scar  - Right Chest Pectoral  Exam: atrophic scar Treatment Plan:  Benign-appearing.  Observation.  Call clinic for new or changing lesions.  Recommend daily use of broad spectrum spf 30+ sunscreen to sun-exposed areas.   ROSACEA and actinic changes with FacialTelangiectasias Exam face erythema with telangiectasias \ Rosacea is a chronic progressive skin condition usually affecting the face of adults, causing redness and/or acne bumps. It is treatable but not curable. It sometimes affects the eyes (ocular rosacea) as well. It may respond to topical and/or systemic medication and can flare with stress, sun exposure, alcohol, exercise, topical steroids (including hydrocortisone/cortisone 10) and some foods.  Daily application of broad spectrum spf 30+ sunscreen to face is recommended to reduce flares.  Patient denies grittiness of the eyes  Counseling for BBL / IPL / Laser and Coordination of Care Discussed the treatment option of Broad Band Light (BBL) /Intense Pulsed Light (IPL)/ Laser for skin discoloration, including brown spots and redness.  Typically we recommend at least 1-3 treatment sessions about 5-8 weeks apart for best results.  Cannot have tanned skin when BBL performed, and regular use of sunscreen/photoprotection is advised after the procedure to help maintain results. The patient's condition may also require maintenance treatments in the future.  The fee for BBL / laser treatments is $350 per treatment session for the whole face.  A fee can be quoted for other parts of the body.  Insurance typically does not pay for BBL/laser treatments and  therefore the fee is an out-of-pocket cost. Recommend prophylactic valtrex treatment. Once scheduled for procedure, will send Rx in prior  to patient's appointment.   HISTORY OF SQUAMOUS CELL CARCINOMA OF THE SKIN on L forearm-posterior - No evidence of recurrence today - No lymphadenopathy - Recommend regular full body skin exams - Recommend daily broad spectrum sunscreen SPF 30+ to sun-exposed areas, reapply every 2 hours as needed.  - Call if any new or changing lesions are noted between office visits  Hx of Aks Clear today.   Return in about 1 year (around 01/27/2025) for tbse.  I, Gordan Beams, CMA, am acting as scribe for Alm Rhyme, MD.   Documentation: I have reviewed the above documentation for accuracy and completeness, and I agree with the above.  Alm Rhyme, MD

## 2024-03-12 ENCOUNTER — Ambulatory Visit: Payer: BC Managed Care – PPO | Admitting: Dermatology

## 2024-05-08 ENCOUNTER — Other Ambulatory Visit: Payer: Self-pay | Admitting: Infectious Diseases

## 2024-05-08 DIAGNOSIS — E041 Nontoxic single thyroid nodule: Secondary | ICD-10-CM

## 2024-05-08 DIAGNOSIS — E119 Type 2 diabetes mellitus without complications: Secondary | ICD-10-CM

## 2024-05-08 DIAGNOSIS — Z9089 Acquired absence of other organs: Secondary | ICD-10-CM

## 2024-05-08 DIAGNOSIS — D649 Anemia, unspecified: Secondary | ICD-10-CM

## 2024-05-08 DIAGNOSIS — E782 Mixed hyperlipidemia: Secondary | ICD-10-CM

## 2024-05-15 ENCOUNTER — Ambulatory Visit
Admission: RE | Admit: 2024-05-15 | Discharge: 2024-05-15 | Disposition: A | Discharge status: Home / Self Care | Payer: Self-pay | Source: Ambulatory Visit | Attending: Infectious Diseases | Admitting: Infectious Diseases

## 2024-05-15 DIAGNOSIS — Z9889 Other specified postprocedural states: Secondary | ICD-10-CM | POA: Insufficient documentation

## 2024-05-15 DIAGNOSIS — E119 Type 2 diabetes mellitus without complications: Secondary | ICD-10-CM | POA: Insufficient documentation

## 2024-05-15 DIAGNOSIS — D649 Anemia, unspecified: Secondary | ICD-10-CM | POA: Insufficient documentation

## 2024-05-15 DIAGNOSIS — Z794 Long term (current) use of insulin: Secondary | ICD-10-CM | POA: Insufficient documentation

## 2024-05-15 DIAGNOSIS — E041 Nontoxic single thyroid nodule: Secondary | ICD-10-CM | POA: Insufficient documentation

## 2024-05-15 DIAGNOSIS — E782 Mixed hyperlipidemia: Secondary | ICD-10-CM | POA: Insufficient documentation

## 2024-05-15 DIAGNOSIS — Z9089 Acquired absence of other organs: Secondary | ICD-10-CM | POA: Insufficient documentation

## 2024-05-26 ENCOUNTER — Encounter: Payer: Self-pay | Admitting: Surgery

## 2025-02-22 ENCOUNTER — Ambulatory Visit: Admitting: Dermatology
